# Patient Record
Sex: Female | Born: 1965
Health system: Southern US, Community
[De-identification: ages and names within clinical notes are randomized; demographics above are authoritative.]

## PROBLEM LIST (undated history)

## (undated) DIAGNOSIS — R87619 Unspecified abnormal cytological findings in specimens from cervix uteri: Secondary | ICD-10-CM

## (undated) HISTORY — PX: APPENDECTOMY: SHX54

## (undated) HISTORY — DX: Unspecified abnormal cytological findings in specimens from cervix uteri: R87.619

## (undated) HISTORY — PX: INTRAUTERINE DEVICE (IUD) INSERTION: SHX5877

---

## 1997-11-07 ENCOUNTER — Other Ambulatory Visit: Admission: RE | Admit: 1997-11-07 | Discharge: 1997-11-07 | Payer: Self-pay | Admitting: Obstetrics and Gynecology

## 1998-11-12 ENCOUNTER — Other Ambulatory Visit: Admission: RE | Admit: 1998-11-12 | Discharge: 1998-11-12 | Payer: Self-pay | Admitting: Obstetrics and Gynecology

## 1999-07-02 ENCOUNTER — Other Ambulatory Visit: Admission: RE | Admit: 1999-07-02 | Discharge: 1999-07-02 | Payer: Self-pay | Admitting: Obstetrics and Gynecology

## 1999-12-22 ENCOUNTER — Encounter: Payer: Self-pay | Admitting: Obstetrics and Gynecology

## 1999-12-22 ENCOUNTER — Ambulatory Visit (HOSPITAL_COMMUNITY): Admission: RE | Admit: 1999-12-22 | Discharge: 1999-12-22 | Payer: Self-pay | Admitting: Obstetrics and Gynecology

## 2000-02-06 ENCOUNTER — Inpatient Hospital Stay (HOSPITAL_COMMUNITY): Admission: AD | Admit: 2000-02-06 | Discharge: 2000-02-11 | Payer: Self-pay | Admitting: Obstetrics and Gynecology

## 2000-02-12 ENCOUNTER — Encounter: Admission: RE | Admit: 2000-02-12 | Discharge: 2000-05-05 | Payer: Self-pay | Admitting: Obstetrics and Gynecology

## 2000-03-16 ENCOUNTER — Other Ambulatory Visit: Admission: RE | Admit: 2000-03-16 | Discharge: 2000-03-16 | Payer: Self-pay | Admitting: Obstetrics and Gynecology

## 2000-05-03 ENCOUNTER — Encounter (INDEPENDENT_AMBULATORY_CARE_PROVIDER_SITE_OTHER): Payer: Self-pay | Admitting: *Deleted

## 2000-05-03 ENCOUNTER — Ambulatory Visit (HOSPITAL_COMMUNITY): Admission: RE | Admit: 2000-05-03 | Discharge: 2000-05-03 | Payer: Self-pay | Admitting: Gastroenterology

## 2001-03-23 ENCOUNTER — Other Ambulatory Visit: Admission: RE | Admit: 2001-03-23 | Discharge: 2001-03-23 | Payer: Self-pay | Admitting: Obstetrics and Gynecology

## 2001-04-18 ENCOUNTER — Other Ambulatory Visit: Admission: RE | Admit: 2001-04-18 | Discharge: 2001-04-18 | Payer: Self-pay | Admitting: Obstetrics and Gynecology

## 2001-10-12 ENCOUNTER — Other Ambulatory Visit: Admission: RE | Admit: 2001-10-12 | Discharge: 2001-10-12 | Payer: Self-pay | Admitting: Obstetrics and Gynecology

## 2002-03-07 ENCOUNTER — Other Ambulatory Visit: Admission: RE | Admit: 2002-03-07 | Discharge: 2002-03-07 | Payer: Self-pay | Admitting: Obstetrics and Gynecology

## 2003-04-12 ENCOUNTER — Other Ambulatory Visit: Admission: RE | Admit: 2003-04-12 | Discharge: 2003-04-12 | Payer: Self-pay | Admitting: Obstetrics and Gynecology

## 2003-04-19 ENCOUNTER — Encounter: Admission: RE | Admit: 2003-04-19 | Discharge: 2003-04-19 | Payer: Self-pay | Admitting: Obstetrics and Gynecology

## 2003-06-17 ENCOUNTER — Ambulatory Visit (HOSPITAL_COMMUNITY): Admission: RE | Admit: 2003-06-17 | Discharge: 2003-06-17 | Payer: Self-pay | Admitting: Gastroenterology

## 2004-03-17 ENCOUNTER — Other Ambulatory Visit: Admission: RE | Admit: 2004-03-17 | Discharge: 2004-03-17 | Payer: Self-pay | Admitting: Obstetrics and Gynecology

## 2005-04-14 ENCOUNTER — Other Ambulatory Visit: Admission: RE | Admit: 2005-04-14 | Discharge: 2005-04-14 | Payer: Self-pay | Admitting: Obstetrics and Gynecology

## 2005-06-10 ENCOUNTER — Encounter: Admission: RE | Admit: 2005-06-10 | Discharge: 2005-06-10 | Payer: Self-pay | Admitting: Obstetrics and Gynecology

## 2005-12-20 ENCOUNTER — Emergency Department (HOSPITAL_COMMUNITY): Admission: EM | Admit: 2005-12-20 | Discharge: 2005-12-20 | Payer: Self-pay | Admitting: Emergency Medicine

## 2006-04-14 ENCOUNTER — Other Ambulatory Visit: Admission: RE | Admit: 2006-04-14 | Discharge: 2006-04-14 | Payer: Self-pay | Admitting: Obstetrics & Gynecology

## 2006-06-22 ENCOUNTER — Encounter: Admission: RE | Admit: 2006-06-22 | Discharge: 2006-06-22 | Payer: Self-pay | Admitting: Obstetrics & Gynecology

## 2007-06-15 ENCOUNTER — Other Ambulatory Visit: Admission: RE | Admit: 2007-06-15 | Discharge: 2007-06-15 | Payer: Self-pay | Admitting: Obstetrics and Gynecology

## 2007-06-20 ENCOUNTER — Encounter: Admission: RE | Admit: 2007-06-20 | Discharge: 2007-06-20 | Payer: Self-pay | Admitting: Obstetrics & Gynecology

## 2007-11-07 ENCOUNTER — Encounter: Admission: RE | Admit: 2007-11-07 | Discharge: 2007-11-07 | Payer: Self-pay | Admitting: Obstetrics & Gynecology

## 2008-08-14 ENCOUNTER — Other Ambulatory Visit: Admission: RE | Admit: 2008-08-14 | Discharge: 2008-08-14 | Payer: Self-pay | Admitting: Obstetrics & Gynecology

## 2008-08-20 ENCOUNTER — Encounter: Admission: RE | Admit: 2008-08-20 | Discharge: 2008-08-20 | Payer: Self-pay | Admitting: Obstetrics & Gynecology

## 2009-06-17 IMAGING — US UNKNOWN US STUDY
1 series · 4 of 4 positions shown · non-contrast
Comparison: Prior studies

CLINICAL DATA: 6-month reevaluation of probably benign left
axillary lymph nodes

DIGITAL DIAGNOSTIC  LEFT BREAST  MAMMOGRAM  WITH CAD AND LEFT
BREAST ULTRASOUND

[Series 1: unknown us study · 4 of 4 slices shown]
[im 1/4]
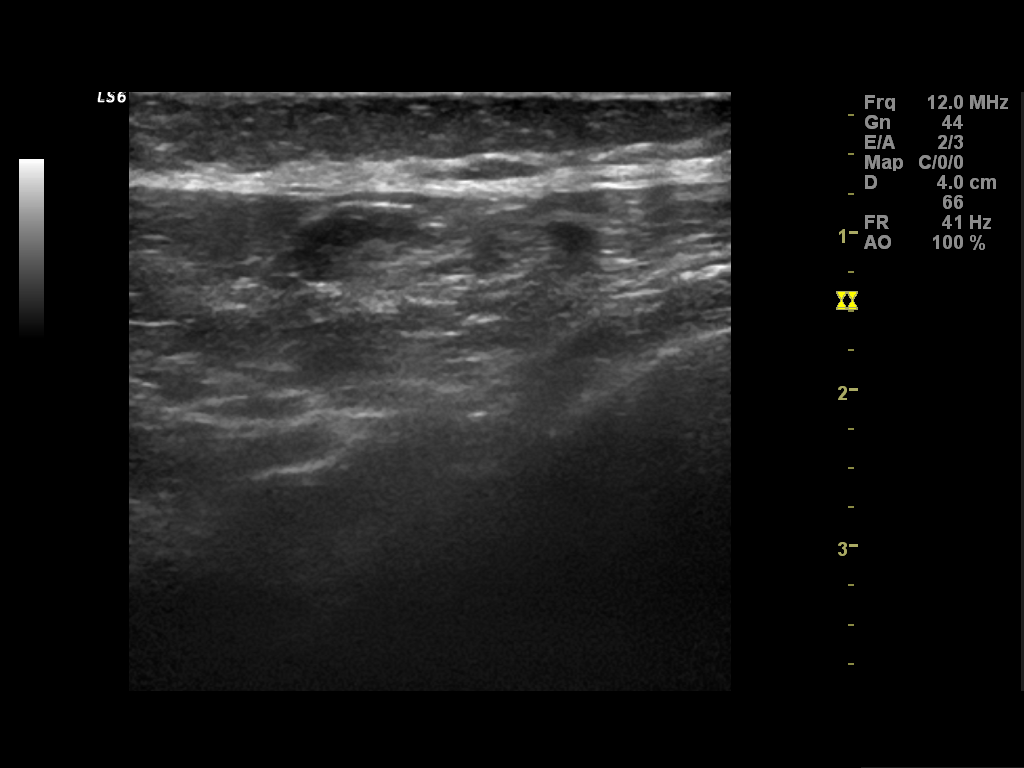
[im 2/4]
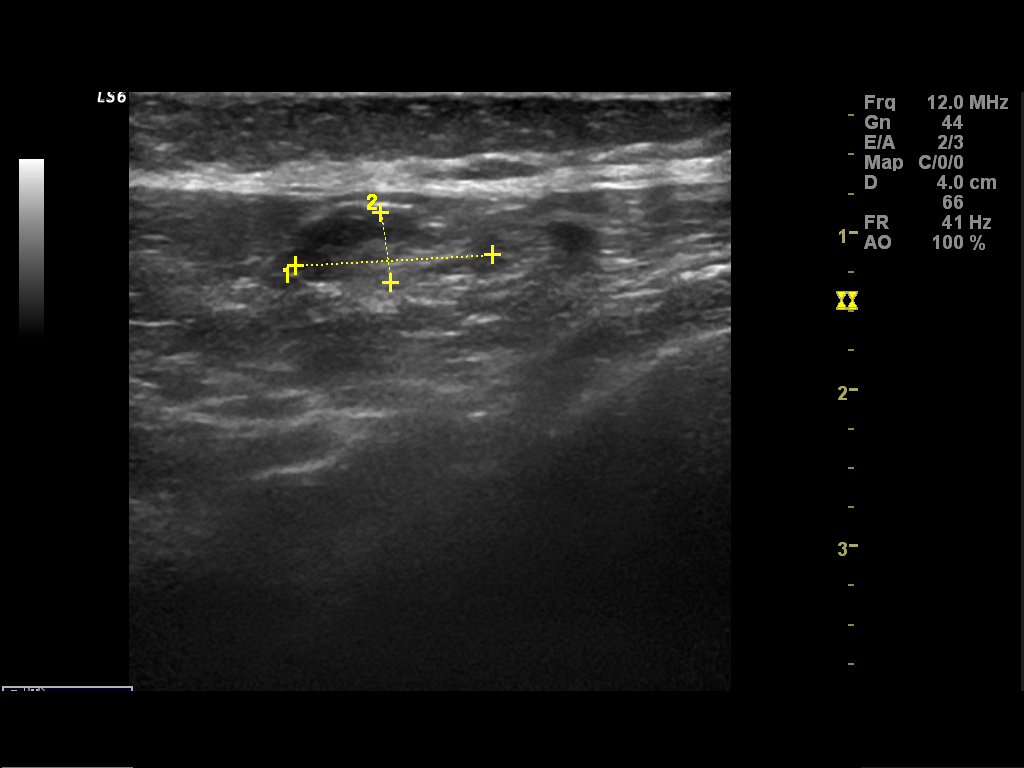
[im 3/4]
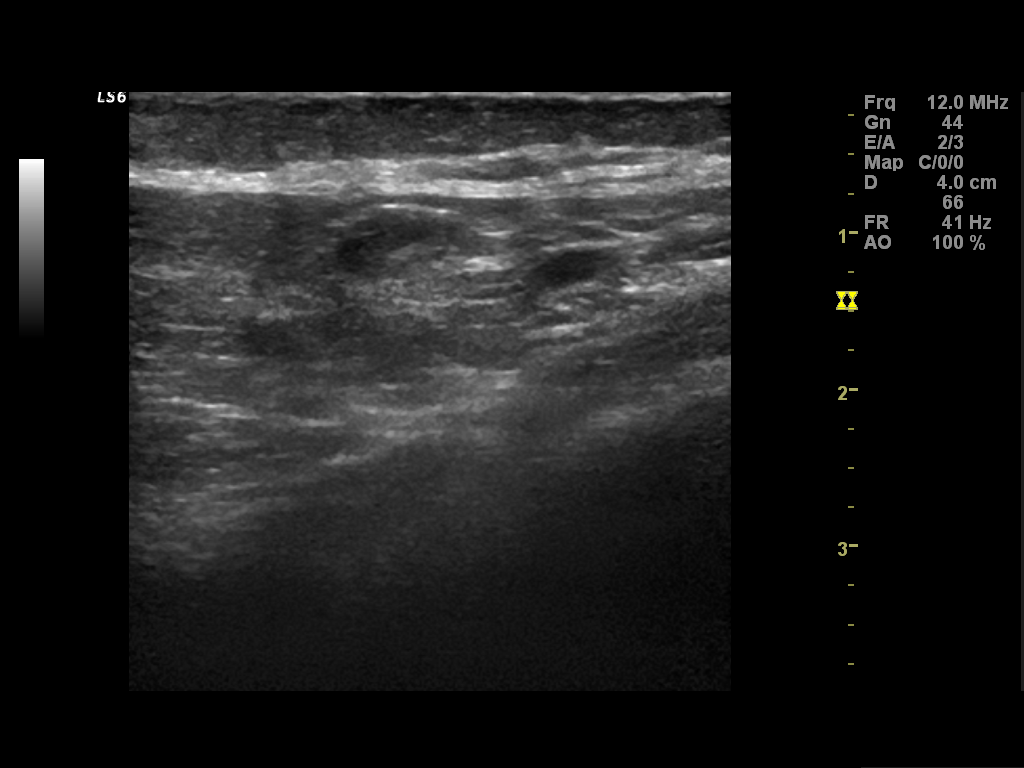
[im 4/4]
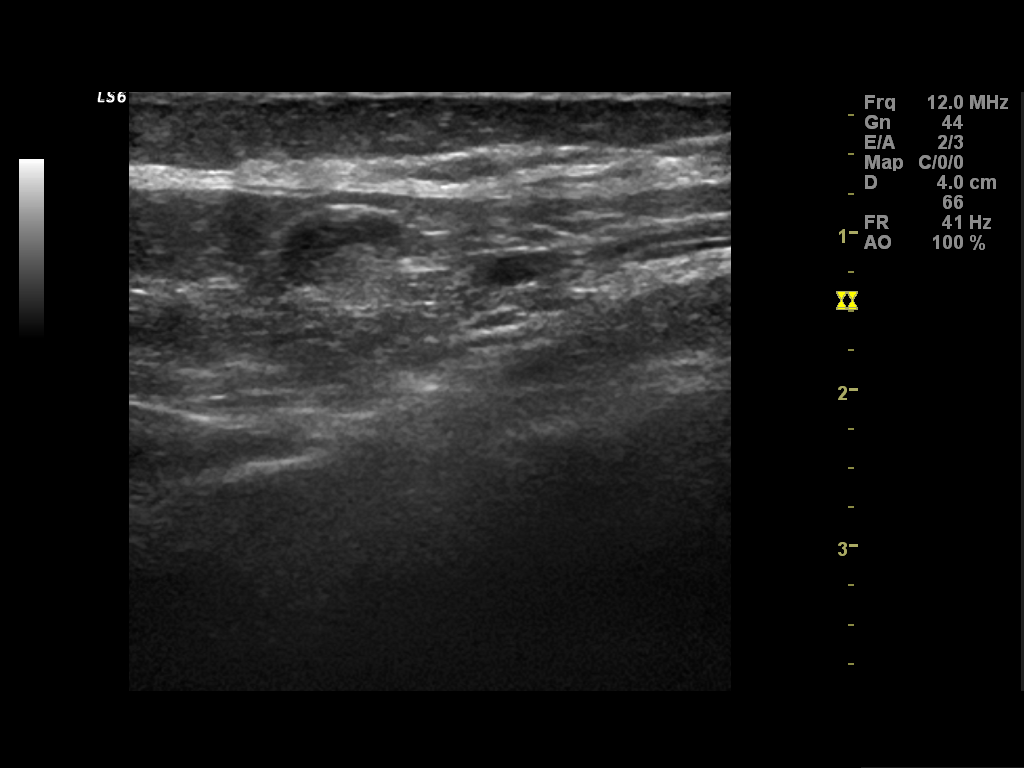

[4 of 4 positions shown; findings below may reference images not displayed]

FINDINGS: There is a heterogeneously dense breast parenchymal
pattern which is stable.  There is no evidence for mass and there
are no areas of distortion.

On physical exam, there is no palpable abnormality.

Ultrasound is performed, showing normal-appearing fibroglandular
tissue within the upper outer quadrant left breast.  The enlarged
left axillary lymph nodes have significantly decreased in size and
normal-appearing 1.3 x 0.5 cm size left axillary lymph node is
identified.  There is no worrisome left axillary adenopathy.
IMPRESSION: Interval decrease in size of the previously enlarged left axillary
lymph nodes.  This is consistent with resolution of reactive
adenopathy.  No specific evidence for malignancy.  Recommend return
to annual screening mammography in May 2008.

BI-RADS CATEGORY 2:  Benign finding(s).

## 2009-09-11 ENCOUNTER — Encounter: Admission: RE | Admit: 2009-09-11 | Discharge: 2009-09-11 | Payer: Self-pay | Admitting: Obstetrics and Gynecology

## 2010-09-28 ENCOUNTER — Other Ambulatory Visit: Payer: Self-pay | Admitting: Obstetrics and Gynecology

## 2010-09-28 DIAGNOSIS — Z1231 Encounter for screening mammogram for malignant neoplasm of breast: Secondary | ICD-10-CM

## 2010-10-15 ENCOUNTER — Ambulatory Visit
Admission: RE | Admit: 2010-10-15 | Discharge: 2010-10-15 | Disposition: A | Discharge status: Home / Self Care | Payer: BC Managed Care – PPO | Source: Ambulatory Visit | Attending: Obstetrics and Gynecology | Admitting: Obstetrics and Gynecology

## 2010-10-15 DIAGNOSIS — Z1231 Encounter for screening mammogram for malignant neoplasm of breast: Secondary | ICD-10-CM

## 2010-10-16 NOTE — Op Note (Signed)
[facility]  Patient:    Becky Lopez, Becky Lopez                         MRN: [id number] Proc. Date: 02/07/00 Adm. Date:  [id number] Attending:  Maxie Better                           Operative Report  PREOPERATIVE DIAGNOSES:       1. Arrest of dilatation.                               2. Asynclitism.                               3. Term gestation.  POSTOPERATIVE DIAGNOSES:      1. Arrest of dilatation.                               2. Right occiput transverse presentation.                               3. Macrosomia.  OPERATION/PROCEDURE:          1. Primary cesarean section.                               2. Kerrs hysterotomy.  ANESTHESIA:                   Epidural.  SURGEON:                      Sheronette A. Cherly Hensen, M.D.  ASSISTANT:                    Roseanna Rainbow, M.D.  DESCRIPTION OF PROCEDURE:     Under adequate epidural anesthesia the patient was placed in the supine position with left lateral tilt.  An indwelling Foley catheter had been placed prior to transfer to the operating room and urine was notable for hematuria prior to start of this procedure.  The patient was sterilely prepped and draped in the usual fashion and a Pfannenstiel skin incision then made and carried down to the rectus fascia using Bovie cautery. The rectus fascia was incised in the midline and the incision extended bilaterally to the rectus fascia and then bluntly and with cautery dissected off the rectus muscles in superior fashion.  The rectus muscles was split in the midline using Mayo scissors and the parietal peritoneum bluntly entered and the incision extended superiorly and inferiorly.  The vesicouterine peritoneum was developed and the bladder then bluntly dissected off the lower uterine segment and displaced on the operative field using a Gilliams retractor.  A low transverse uterine incision was then made and the incision extended bilaterally  using bandage scissors.  A copious amount of fluid was noted without any meconium.  Subsequent delivery of a viable female infant from the right occiput transverse presentation was accomplished.  The infant was bulb suctioned on the abdomen and the cord clamped and cut, and the infant was transferred to the awaiting pediatricians, who subsequently assigned Apgar scores of 8 at one minute and 9 at  five minutes.  The placenta was spontaneously delivered intact.  Normal tubes and ovaries were noted bilaterally.  The uterine cavity was cleaned of debris and no intrauterine abnormalities were noted.  The uterine incision was closed in two layers, the first with a running locked stitch of 0 Monocryl and the second layer imbricated using 0 Monocryl suture.  Small bleeders along the vesicouterine peritoneum edge were cauterized and the abdomen irrigated and suctioned of any debris.  The pericolic gutters were cleaned of debris and the rectus muscles and undersurface of the rectus fascia inspected.  Small bleeders were cauterized, with good hemostasis subsequently noted.  The rectus fascia was closed with 0 Vicryl x 2.  The subcutaneous area was irrigated and small bleeders cauterized.  The subcuticular area was injected with Marcaine 0.25% and the skin approximated using Ethicon staples.  Specimen was placenta, not sent to pathology.  Estimated blood loss was 600 cc.  Intraoperative fluid was 3 liters crystalloid.  Urine output was 1000 cc of clear yellow urine. Sponge, needle, and instrument counts x 2 were correct.  There were no complications.  The patient tolerated the procedure well and was transferred to the recovery room in stable condition. DD:  02/07/00 TD:  02/08/00 Job: [id number]

## 2010-10-16 NOTE — Discharge Summary (Signed)
[facility]  Patient:    Becky Lopez, Becky Lopez                         MRN: [id number] Adm. Date:  [id number] Disc. Date: [id number] Attending:  Maxie Better                           Discharge Summary  ADMITTING DIAGNOSES:          1. Active labor, term gestation.                               2. Pregnancy-induced hypertension, rule out                                  preeclampsia.                               3. Recurrent herpes simplex outbreak.  DISCHARGE DIAGNOSES:          1. Term gestation, delivered, status post                                  primary cesarean section.                               2. Recurrent herpes simplex outbreak.                               3. Fetal macrosomia.                               4. Arrest of dilatation.  PROCEDURE:                    Primary cesarean section.  HOSPITAL COURSE:              This is a 45 year old gravida 3, para 0, female at term, presented in active labor February 06, 2000.  At that time, the patient had initially presented whereupon her cervical exam was notable for her cervix being 4 cm dilatation.  The patient ambulated for about two hours, and her cervix progressed to 5 cm.  The patient was noted to have a left buttock vesicles consistent with herpes infection.  Her prenatal course had been uncomplicated, blood type being A positive.  Antibody screen was negative.  Rubella was immune.  Hepatitis B surface antigen was negative.  The patient underwent an amniocentesis which showed a normal karyotype.  Group B strep culture was negative.  The patient had a blood pressure of 163/92 with no other findings suggestive of preeclampsia.  PIH labs were obtained.  The tracing was reactive.  She had irregular contractions.  Due to the location of the herpes simplex outbreak, the area being far away from the vaginal area, did not necessitate a cesarean section.  The [facility] labs were normal.   The patient subsequently had an epidural placement for labor pain management with a cervical exam that remained unchanged.  The patient had arrest of dilatation secondary  to inadequate labor force.  The patient progressed to 9 cm with a swollen anterior lip.  Her active phase became protracted.  Pitocin was continued.  Intrauterine pressure catheter was subsequently placed.  Her [place] units were between 175 and 210.  The patient was contracting every 2-3 minutes.  Her cervix remained unchanged at 9 cm with a swollen anterior lip, +1 station, asynclitic presentation.  The patient was advised of the need for a primary cesarean section due to the findings.  She was therefore taken to the operating room where she underwent a primary cesarean section.  The findings were that of a baby that was 9 pounds 7 ounces, in the right occipital transverse presentation, live female.  Normal tubes and ovaries were noted.  Placenta was spontaneous intact.  Her estimated blood loss was 600 cc. The patients postoperative course was notable for a temperature maximum of 101.8.  She had been diagnosed with a possible mild endomyometritis and was started on ______ gentamicin, and clindamycin on February 08, 2000, after she had her initial temperature spike.  The patient was continued on her antibiotics until afebrile for greater than 24 hours.  Her CBC on Postoperative day #1 showed a white count of 16.7, hemoglobin 9.4, hematocrit 27.2.  On postoperative day #4, incision was inspected and slight erythema noted.  The incision was probed for about 10 cc of serosanguineous fluid and intact rectal fascia.  This finding was consistent with a wound seroma.  The patient did not have her staples removed.  She was felt otherwise well to be discharged home having been afebrile for greater than 24 hours, tolerating a regular diet, and no other clinical findings.  DISPOSITION:                  Home.  CONDITION ON  DISCHARGE:       Stable.  DISCHARGE INSTRUCTIONS:       Follow up in the office for staple removal on Monday and a postpartum check at four weeks.  Call for temperature greater or equal to 100.4, increased incisional pain, redness, or incisional drainage. Nothing per vagina for 4-6 weeks.  No driving or heavy lifting for two weeks. Call for severe abdominal pain,  ______ frequently, passage of clots. DD:  02/28/00 TD:  02/28/00 Job: [id number]

## 2010-10-16 NOTE — Op Note (Signed)
NAME:  Becky Lopez, Becky Lopez                            ACCOUNT NO.:  [id number]   MEDICAL RECORD NO.:  [id number]                   PATIENT TYPE:  AMB   LOCATION:  ENDO                                 FACILITY:  [facility]   PHYSICIAN:  Anselmo Rod, M.D.               DATE OF BIRTH:  07-22-1965   DATE OF PROCEDURE:  06/12/2003  DATE OF DISCHARGE:                                 OPERATIVE REPORT   PROCEDURE PERFORMED:  Screening colonoscopy.   ENDOSCOPIST:  Charna Elizabeth, M.D.   INSTRUMENT USED:  Olympus video colonoscope.   INDICATIONS FOR PROCEDURE:  The patient is a 45 year old white female with a  personal history of adenomatous polyps undergoing a complete colonoscopy.  Rule out colonic polyps, masses, etc.   PREPROCEDURE PREPARATION:  Informed consent was procured from the patient.  The patient was fasted for eight hours prior to the procedure and prepped  with a bottle of magnesium citrate and a gallon of GoLYTELY the night prior  to the procedure.   PREPROCEDURE PHYSICAL:  The patient had stable vital signs.  Neck supple.  Chest clear to auscultation.  S1 and S2 regular.  Abdomen soft with normal  bowel sounds.   DESCRIPTION OF PROCEDURE:  The patient was placed in left lateral decubitus  position and sedated with 70 mg of Demerol and 8 mg of Versed intravenously.  Once the patient was adequately sedated and maintained on low flow oxygen  and continuous cardiac monitoring, the Olympus video colonoscope was  advanced  from the rectum to the cecum with difficulty.  There was a large  amount of residual stool in the colon.  Multiple washes were done.  The  patient's position was changed from the left lateral to the supine position  to mobilize the stool and visualize the underlying colonic mucosa.  The  appendicular orifice and ileocecal valve were clearly visualized and  photographed.  No masses, polyps, erosions, ulcerations or diverticula were  seen.  Retroflexion in the rectum  revealed no abnormalities.   IMPRESSION:  1. Essentially normal colonoscopy.  No masses or polyps seen.  2. Some residual stool in the colon.  Small lesions could have been missed.   RECOMMENDATIONS:  1. Repeat colorectal cancer screening is recommended in the next five years     unless the patient develops any     abnormal symptoms in the interim.  2. Continue high fiber diet with liberal fluid intake.  3. Outpatient followup as need arises in the future.                                               Anselmo Rod, M.D.    JNM/MEDQ  D:  06/17/2003  T:  06/17/2003  Job:  [id number]  cc:   Crist Fat. Rivard, M.D.  [address]  [place]  [place] [address]  Fax: [phone]

## 2010-10-16 NOTE — Procedures (Signed)
[place]  Patient:    Becky Lopez, Becky Lopez                         MRN: [id number] Proc. Date: 05/03/00 Adm. Date:  [id number] Disc. Date: [id number] Attending:  Charna Elizabeth CC:         Hayes Ludwig, [facility]   Procedure Report  DATE OF BIRTH:  November 19, 1955.  PROCEDURE:  Colonoscopy with snare polypectomy x 1.  ENDOSCOPIST:  Anselmo Rod, M.D.  INSTRUMENT USED:  Olympus video colonoscope.  INDICATION FOR PROCEDURE:  A 45 year old white female with a history of rectal bleeding and a family history of colon cancer in her father.  Rule out colonic polyps, masses, hemorrhoids, etc.  PREPROCEDURE PREPARATION:  Informed consent was procured from the patient. The patient was fasted for eight hours prior to the procedure and prepped with a bottle of magnesium citrate and a gallon of NuLytely the night prior to the procedure.  PREPROCEDURE PHYSICAL:  VITAL SIGNS:  The patient had stable vital signs.  NECK:  Supple.  CHEST:  Clear to auscultation.  S1, S2 regular.  ABDOMEN:  Soft with normal abdominal bowel sounds.  DESCRIPTION OF PROCEDURE:  The patient was placed in the left lateral decubitus position and sedated with 70 mg of Demerol and 5 mg of Versed intravenously.  Once the patient was adequately sedate and maintained on low-flow oxygen and continuous cardiac monitoring, the Olympus video colonoscope was advanced into the rectum to the cecum without difficulty. a small sessile polyp was snared from 10 cm.  The colon was otherwise normal up to the cecum.  IMPRESSION:  Normal colonoscopy except for a small sessile polyp snared at 10 cm.  RECOMMENDATIONS: 1. Await pathology results. 2. Avoid nonsteroidals for the next 10 days. 3. Outpatient follow-up in the next two weeks. DD:  05/04/00 TD:  05/05/00 Job: [id number]

## 2011-04-22 IMAGING — MG MM DIGITAL SCREENING
4 series · 4 of 4 positions shown · non-contrast
Comparison: none

DG SCREEN MAMMOGRAM BILATERAL
Bilateral CC and MLO view(s) were taken.
Technologist: John Mckay Montesin

DIGITAL SCREENING MAMMOGRAM WITH CAD:
The breast tissue is heterogeneously dense.  No masses or malignant type calcifications are 
identified.  Compared with prior studies.
Images were processed with CAD.

[R CC]
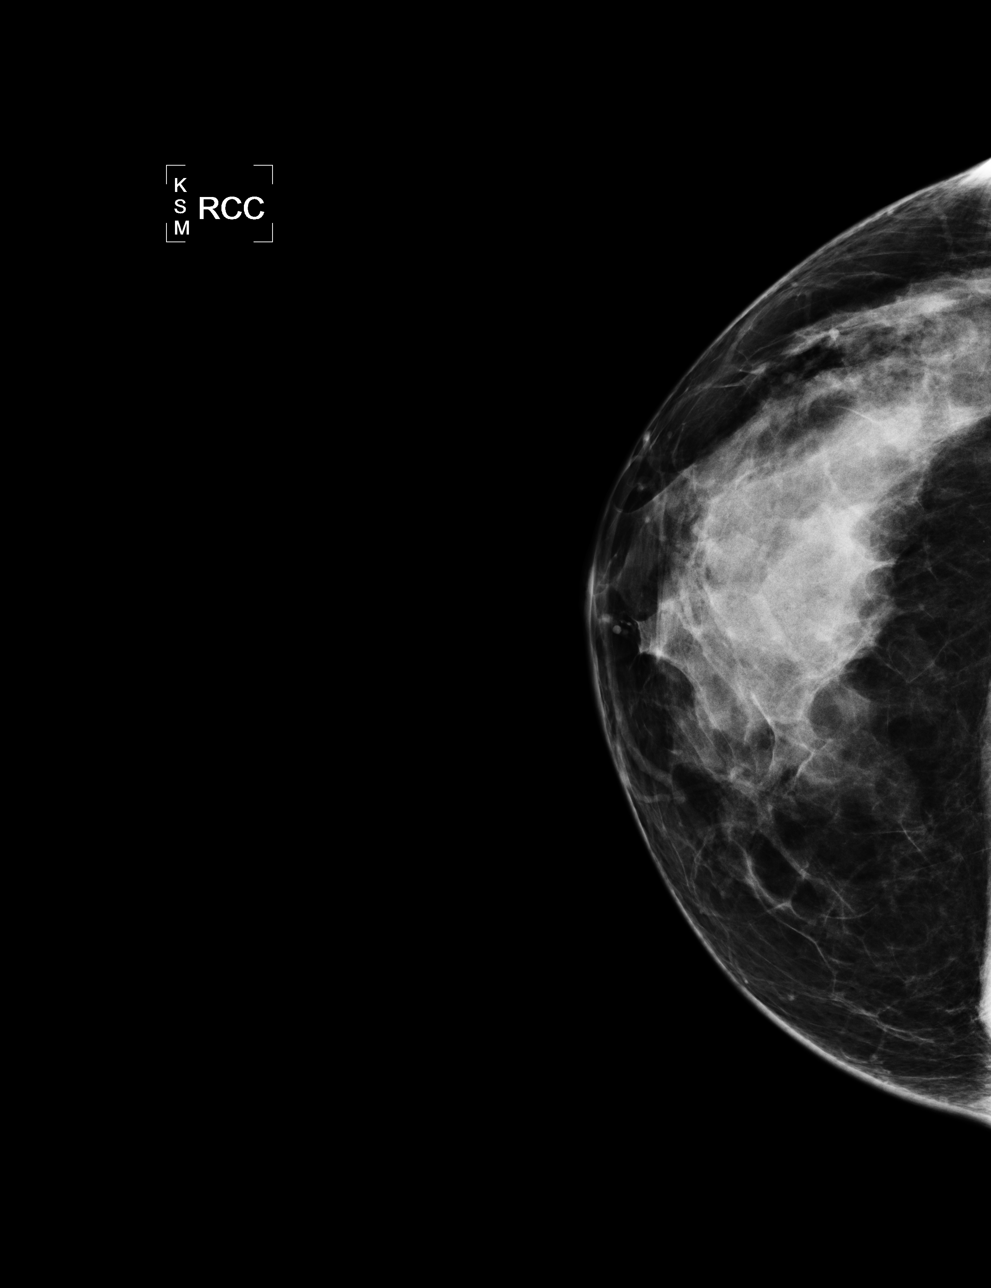

[L CC]
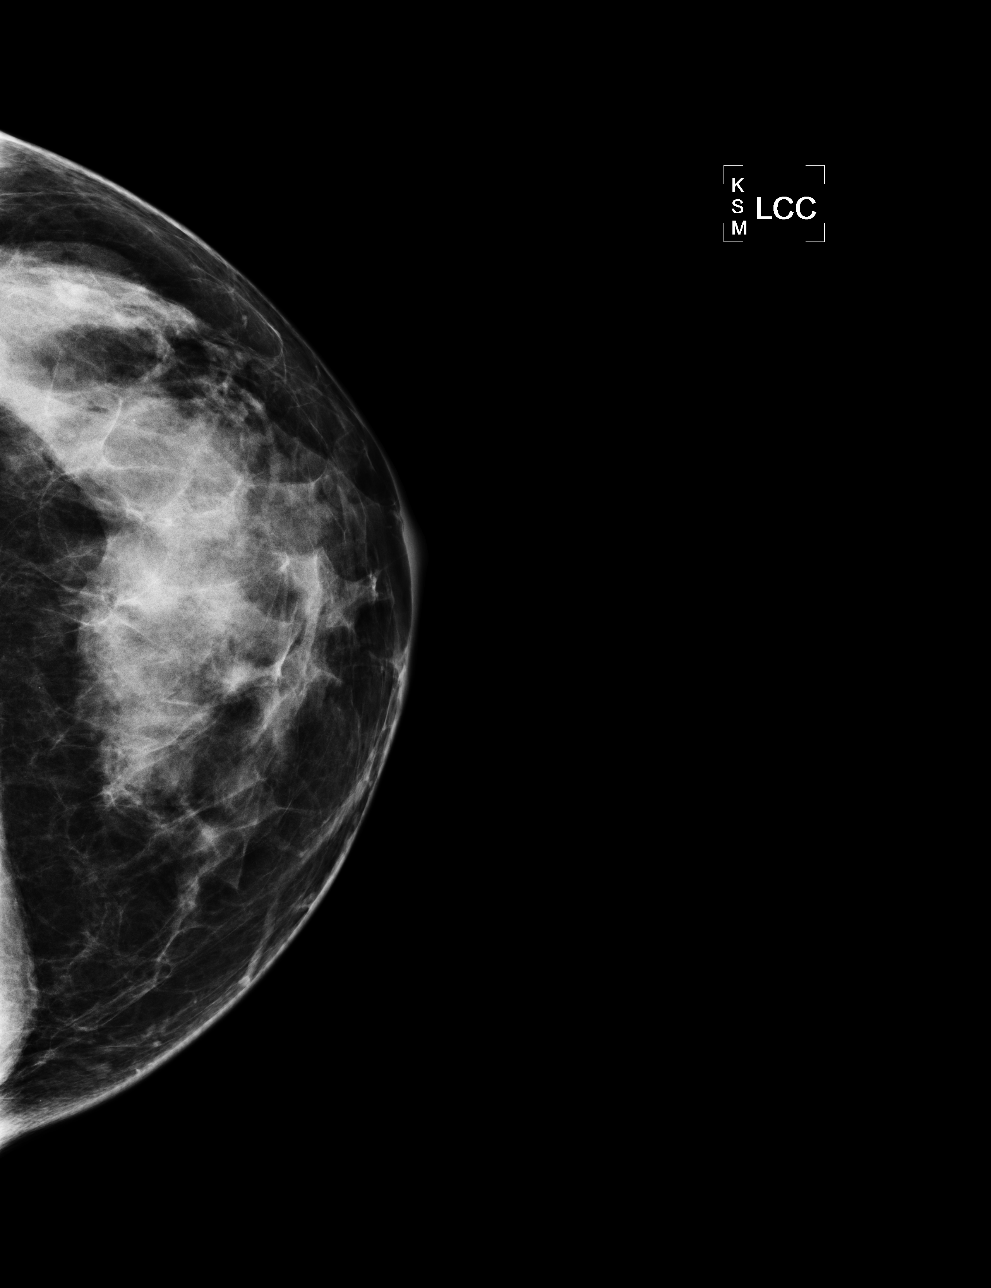

[L MLO]
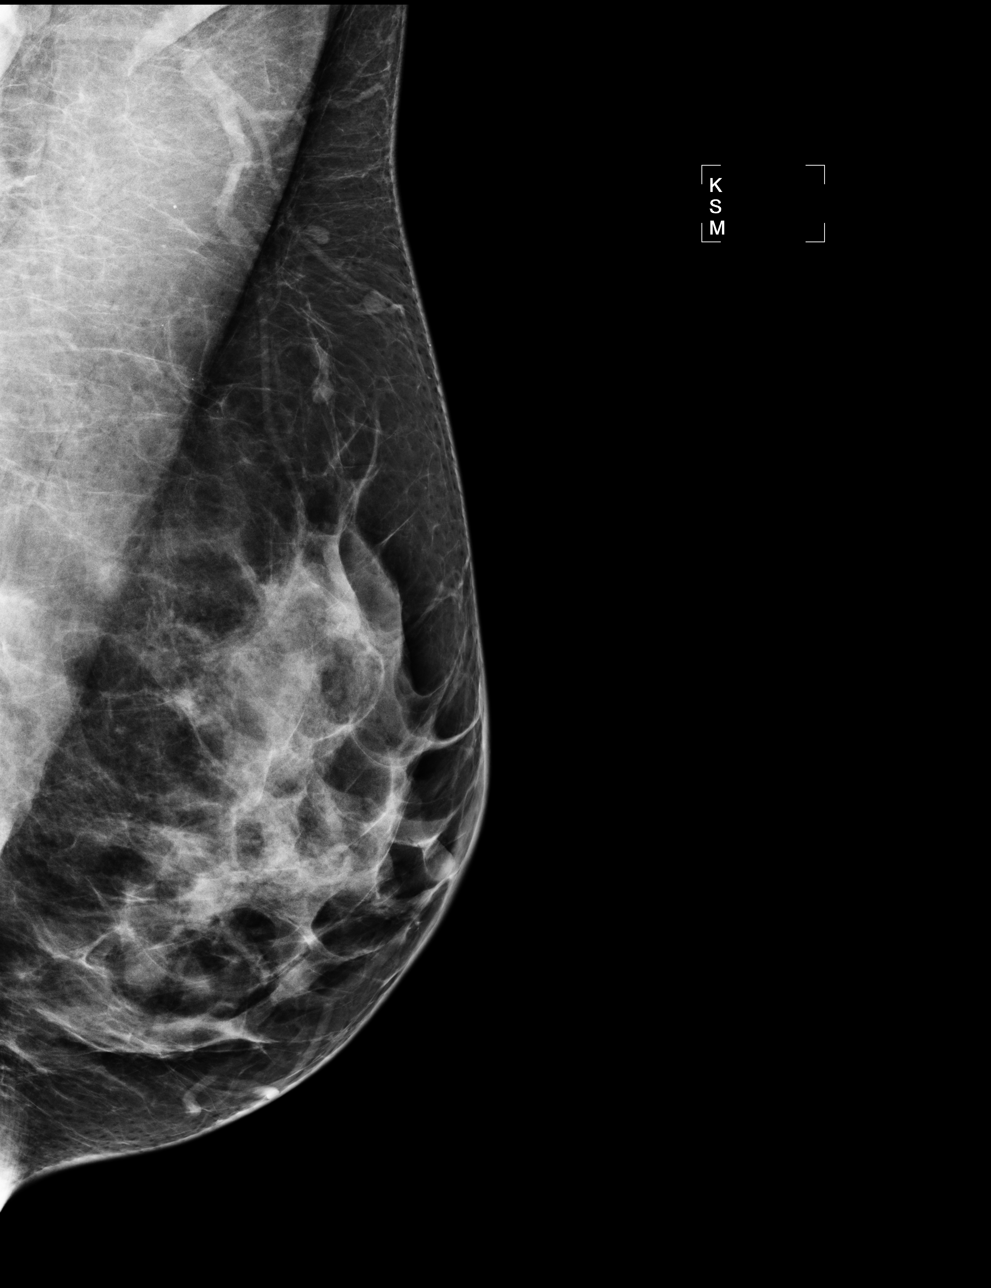

[R MLO]
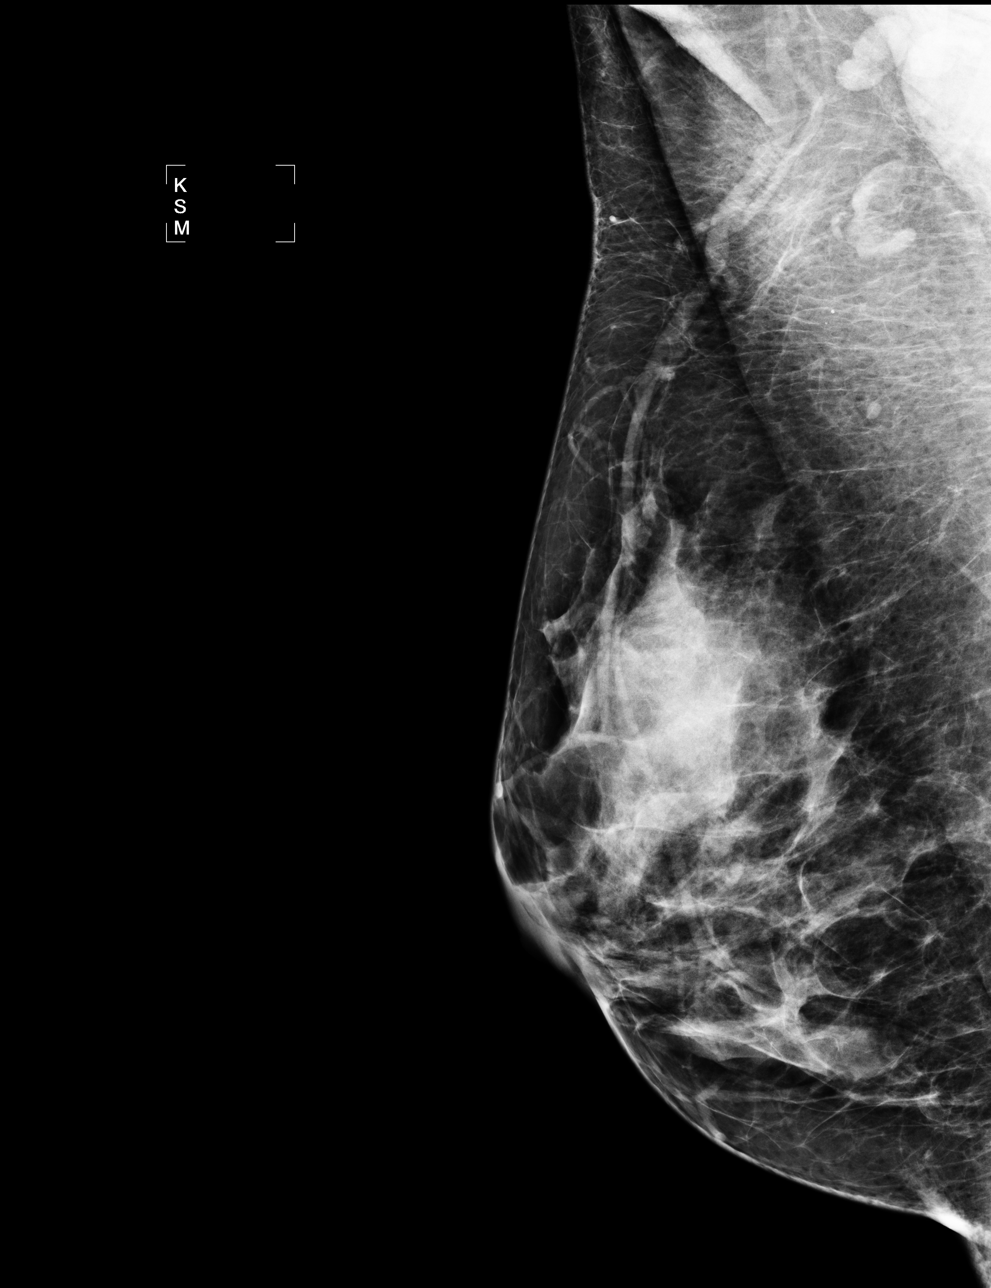

[4 of 4 positions shown; findings below may reference images not displayed]

IMPRESSION: No specific mammographic evidence of malignancy.  Next screening mammogram is recommended in one 
year.

A result letter of this screening mammogram will be mailed directly to the patient.

ASSESSMENT: Negative - BI-RADS 1

Screening mammogram in 1 year.
,

## 2011-11-22 ENCOUNTER — Other Ambulatory Visit: Payer: Self-pay | Admitting: Obstetrics and Gynecology

## 2011-11-22 DIAGNOSIS — Z1231 Encounter for screening mammogram for malignant neoplasm of breast: Secondary | ICD-10-CM

## 2011-11-24 ENCOUNTER — Ambulatory Visit
Admission: RE | Admit: 2011-11-24 | Discharge: 2011-11-24 | Disposition: A | Discharge status: Home / Self Care | Payer: BC Managed Care – PPO | Source: Ambulatory Visit | Attending: Obstetrics and Gynecology | Admitting: Obstetrics and Gynecology

## 2011-11-24 DIAGNOSIS — Z1231 Encounter for screening mammogram for malignant neoplasm of breast: Secondary | ICD-10-CM

## 2012-10-31 ENCOUNTER — Other Ambulatory Visit: Payer: Self-pay

## 2012-10-31 DIAGNOSIS — Z1231 Encounter for screening mammogram for malignant neoplasm of breast: Secondary | ICD-10-CM

## 2012-11-23 ENCOUNTER — Encounter: Payer: Self-pay | Admitting: *Deleted

## 2012-11-27 ENCOUNTER — Encounter: Payer: Self-pay | Admitting: Certified Nurse Midwife

## 2012-11-27 ENCOUNTER — Ambulatory Visit (INDEPENDENT_AMBULATORY_CARE_PROVIDER_SITE_OTHER): Payer: BC Managed Care – PPO | Admitting: Certified Nurse Midwife

## 2012-11-27 VITALS — BP 110/64 | HR 60 | Resp 16 | Ht 67.25 in | Wt 159.0 lb

## 2012-11-27 DIAGNOSIS — Z0000 Encounter for general adult medical examination without abnormal findings: Secondary | ICD-10-CM

## 2012-11-27 DIAGNOSIS — Z01419 Encounter for gynecological examination (general) (routine) without abnormal findings: Secondary | ICD-10-CM

## 2012-11-27 LAB — COMPREHENSIVE METABOLIC PANEL
AST: 16 U/L (ref 0–37)
Albumin: 4.2 g/dL (ref 3.5–5.2)
BUN: 14 mg/dL (ref 6–23)
Calcium: 9.2 mg/dL (ref 8.4–10.5)
Chloride: 105 mEq/L (ref 96–112)
Creat: 0.75 mg/dL (ref 0.50–1.10)
Glucose, Bld: 88 mg/dL (ref 70–99)

## 2012-11-27 LAB — POCT URINALYSIS DIPSTICK
Blood, UA: NEGATIVE
Ketones, UA: NEGATIVE
Leukocytes, UA: NEGATIVE
Nitrite, UA: NEGATIVE
Protein, UA: NEGATIVE
pH, UA: 5

## 2012-11-27 LAB — LIPID PANEL
HDL: 63 mg/dL (ref >39)
Total CHOL/HDL Ratio: 2.9 Ratio
Triglycerides: 87 mg/dL (ref <150)

## 2012-11-27 LAB — HEMOGLOBIN, FINGERSTICK: Hemoglobin, fingerstick: 12.8 g/dL (ref 12.0–16.0)

## 2012-11-27 NOTE — Progress Notes (Signed)
47 y.o. G57P1 Married Caucasian Fe here for annual exam. Periods scant with Mirena IUD. Happy with contraception choice. Has not had reason to see PCP. 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 No health issues today.  Mammogram scheduled for tomorrow.   Patient's last menstrual period was 11/25/2012.          Sexually active: yes  The current method of family planning is IUD.    Exercising: yes  run, swim, run a farm Smoker:  no  Health Maintenance: Pap: 11-24-11 neg HPV HR neg MMG:  11-24-11 normal Colonoscopy:  2011 BMD:   none TDaP:  06-15-07 h Labs: Poct urine- neg Self breast exam: occ   reports that she has quit smoking. She does not have any smokeless tobacco history on file. She reports that she drinks about 0.6 ounces of alcohol per week. She reports that she does not use illicit drugs.  History reviewed. No pertinent past medical history.  History reviewed. No pertinent past surgical history.  Current Outpatient Prescriptions  Medication Sig Dispense Refill  . levonorgestrel (MIRENA) 20 MCG/24HR IUD 1 each by Intrauterine route once.       No current facility-administered medications for this visit.    Family History  Problem Relation Age of Onset  . Hypertension Mother   . Diabetes Father   . Coronary artery disease Father   . Colon cancer Father     ROS:  Pertinent items are noted in HPI.  Otherwise, a comprehensive ROS was negative.  Exam:   BP 110/64  Pulse 60  Resp 16  Ht 5' 7.25" (1.708 m)  Wt 159 lb (72.122 kg)  BMI 24.72 kg/m2  LMP 11/25/2012 Height: 5' 7.25" (170.8 cm)  Ht Readings from Last 3 Encounters:  11/27/12 5' 7.25" (1.708 m)    General appearance: alert, cooperative and appears stated age Head: Normocephalic, without obvious abnormality, atraumatic Neck: no adenopathy, supple, symmetrical, trachea midline and thyroid normal to inspection and palpation Lungs: clear to auscultation bilaterally Breasts: normal appearance, no masses or  tenderness, No nipple retraction or dimpling, No nipple discharge or bleeding, No axillary or supraclavicular adenopathy Heart: regular rate and rhythm Abdomen: soft, non-tender; no masses,  no organomegaly Extremities: extremities normal, atraumatic, no cyanosis or edema Skin: Skin color, texture, turgor normal. No rashes or lesions Lymph nodes: Cervical, supraclavicular, and axillary nodes normal. No abnormal inguinal nodes palpated Neurologic: Grossly normal   Pelvic: External genitalia:  no lesions              Urethra:  normal appearing urethra with no masses, tenderness or lesions              Bartholin's and Skene's: normal                 Vagina: normal appearing vagina with normal color and discharge, no lesions              Cervix: normal, non tender, IUD string noted              Pap taken: no Bimanual Exam:  Uterus:  normal size, contour, position, consistency, mobility, non-tender and anteverted              Adnexa: normal adnexa and no mass, fullness, tenderness               Rectovaginal: Confirms               Anus:  normal sphincter tone, no lesions  A:  Well  Woman with normal exam  Contraception Mirena IUD , removal due 2017  P:   Reviewed health and wellness pertinent to exam  Aware of bleeding profile with Mirena IUD  Labs: CMP,Lipid panel, TSH, Hgb.  Pap smear as per guidelines   Mammogram yearly pap smear not taken today  counseled on breast self exam, mammography screening, adequate intake of calcium and vitamin D, diet and exercise, Kegel's exercises  return annually or prn  An After Visit Summary was printed and given to the patient.

## 2012-11-27 NOTE — Patient Instructions (Signed)

## 2012-11-28 ENCOUNTER — Ambulatory Visit: Payer: BC Managed Care – PPO

## 2012-11-28 NOTE — Progress Notes (Signed)
Reviewed CPRomine, MD 

## 2012-11-29 ENCOUNTER — Telehealth: Payer: Self-pay

## 2012-11-29 NOTE — Telephone Encounter (Signed)
Message copied by Eliezer Bottom on Wed Nov 29, 2012 10:24 AM ------      Message from: Verner Chol      Created: Wed Nov 29, 2012  7:57 AM       Cholesterol panel optimal level, Liver, kidney, glucose profile normal      Thyroid level normal ------

## 2012-11-29 NOTE — Telephone Encounter (Signed)
Patient notified of results.

## 2012-11-29 NOTE — Telephone Encounter (Signed)
lmtcb

## 2013-01-11 ENCOUNTER — Ambulatory Visit
Admission: RE | Admit: 2013-01-11 | Discharge: 2013-01-11 | Disposition: A | Discharge status: Home / Self Care | Payer: BC Managed Care – PPO | Source: Ambulatory Visit

## 2013-01-11 DIAGNOSIS — Z1231 Encounter for screening mammogram for malignant neoplasm of breast: Secondary | ICD-10-CM

## 2013-04-05 ENCOUNTER — Other Ambulatory Visit: Payer: Self-pay

## 2013-11-28 ENCOUNTER — Ambulatory Visit (INDEPENDENT_AMBULATORY_CARE_PROVIDER_SITE_OTHER): Payer: BC Managed Care – PPO | Admitting: Certified Nurse Midwife

## 2013-11-28 ENCOUNTER — Encounter: Payer: Self-pay | Admitting: Certified Nurse Midwife

## 2013-11-28 VITALS — BP 120/78 | HR 72 | Resp 16 | Ht 67.25 in | Wt 163.0 lb

## 2013-11-28 DIAGNOSIS — Z124 Encounter for screening for malignant neoplasm of cervix: Secondary | ICD-10-CM

## 2013-11-28 DIAGNOSIS — Z01419 Encounter for gynecological examination (general) (routine) without abnormal findings: Secondary | ICD-10-CM

## 2013-11-28 DIAGNOSIS — N390 Urinary tract infection, site not specified: Secondary | ICD-10-CM

## 2013-11-28 DIAGNOSIS — Z0000 Encounter for general adult medical examination without abnormal findings: Secondary | ICD-10-CM

## 2013-11-28 LAB — POCT URINALYSIS DIPSTICK
Bilirubin, UA: NEGATIVE
Glucose, UA: NEGATIVE
Ketones, UA: NEGATIVE
NITRITE UA: NEGATIVE
PROTEIN UA: NEGATIVE
UROBILINOGEN UA: NEGATIVE
pH, UA: 8

## 2013-11-28 MED ORDER — NITROFURANTOIN MONOHYD MACRO 100 MG PO CAPS: 100.0000 mg | ORAL_CAPSULE | Freq: Two times a day (BID) | ORAL | Status: DC

## 2013-11-28 NOTE — Progress Notes (Signed)
48 y.o. [id number] Married Caucasian Fe here for annual exam. Periods very scant or spotting with Mirena IUD, happy with choice. Patient complaining of urinary frequency, urgency and pain with urination for the past few days and has increased." Feels like UTI". Patient has had in past but several years ago. Sees PCP prn. Denies fever, chills or back ache. Patient did see blood in urine this am. No history of kidney stones. No other health issues.  Patient's last menstrual period was 11/24/2013.          Sexually active: Yes.    The current method of family planning is IUD.    Exercising: Yes.    Swim, run, lift weights & yoga Smoker:  no  Health Maintenance: Pap: 11-21-11 neg HPV HR neg MMG: 01-11-13  Density category c: bir-rads category 1: neg Colonoscopy: 2011 5 years BMD:   none TDaP:  2009 Labs: Poct urine-wbc 2+, rbc 2+, ph 8.0, hgb-12.6 Self breast exam: done monthly   reports that she has quit smoking. She does not have any smokeless tobacco history on file. She reports that she drinks about 3.6 ounces of alcohol per week. She reports that she does not use illicit drugs.  History reviewed. No pertinent past medical history.  Past Surgical History  Procedure Laterality Date  . Appendectomy    . Cesarean section    . Intrauterine device (iud) insertion      mirena inserted 06-05-10, due out 1/17    Current Outpatient Prescriptions  Medication Sig Dispense Refill  . levonorgestrel (MIRENA) 20 MCG/24HR IUD 1 each by Intrauterine route once.       No current facility-administered medications for this visit.    Family History  Problem Relation Age of Onset  . Hypertension Mother   . Diabetes Father   . Coronary artery disease Father   . Colon cancer Father   . Cancer Father     prostate  . Emphysema Maternal Grandfather   . Cancer Paternal Grandmother   . Coronary artery disease Paternal Grandfather     ROS:  Pertinent items are noted in HPI.  Otherwise, a comprehensive  ROS was negative.  Exam:   BP 120/78  Pulse 72  Resp 16  Ht 5' 7.25" (1.708 m)  Wt 163 lb (73.936 kg)  BMI 25.34 kg/m2  LMP 11/24/2013 Height: 5' 7.25" (170.8 cm)  Ht Readings from Last 3 Encounters:  11/28/13 5' 7.25" (1.708 m)  11/27/12 5' 7.25" (1.708 m)    General appearance: alert, cooperative and appears stated age Head: Normocephalic, without obvious abnormality, atraumatic Neck: no adenopathy, supple, symmetrical, trachea midline and thyroid normal to inspection and palpation and non-palpable Lungs: clear to auscultation bilaterally CVAT bilateral negative Breasts: normal appearance, no masses or tenderness, No nipple retraction or dimpling, No nipple discharge or bleeding, No axillary or supraclavicular adenopathy Heart: regular rate and rhythm Abdomen: soft, positive for suprapubic tenderness; no masses,  no organomegaly Extremities: extremities normal, atraumatic, no cyanosis or edema Skin: Skin color, texture, turgor normal. No rashes or lesions, warm and dry Lymph nodes: Cervical, supraclavicular, and axillary nodes normal. No abnormal inguinal nodes palpated Neurologic: Grossly normal   Pelvic: External genitalia:  no lesions              Urethra:  normal appearing urethra with no masses, with tenderness or lesions  Bladder and urethral meatus tender              Bartholin's and Skene's: normal  Vagina: normal appearing vagina with normal color and discharge, no lesions              Cervix: normal, non tender              Pap taken: Yes.   Bimanual Exam:  Uterus:  normal size, contour, position, consistency, mobility, non-tender and anteflexed              Adnexa: normal adnexa and no mass, fullness, tenderness               Rectovaginal: Confirms               Anus:  normal sphincter tone, no lesions  A:  Well Woman with normal exam  Contraception Mirena IUD removal due 2017  UTI  P:   Reviewed health and wellness pertinent to exam  Aware  of warning signs with IUD  Rx Macrobid see order  Warning signs of UTI given  Lab Urine culture, micro  Pap smear taken today with HPV reflex   counseled on breast self exam, mammography screening and need for 3 D mammogram due to breast density C, adequate intake of calcium and vitamin D, diet and exercise  return annually or prn  An After Visit Summary was printed and given to the patient.

## 2013-11-29 LAB — URINALYSIS, MICROSCOPIC ONLY
BACTERIA UA: NONE SEEN
Casts: NONE SEEN
Crystals: NONE SEEN
Squamous Epithelial / LPF: NONE SEEN

## 2013-12-01 LAB — URINE CULTURE: Colony Count: 40000

## 2013-12-03 NOTE — Progress Notes (Signed)
Reviewed personally.  M. Suzanne Philamena Kramar, MD.  

## 2013-12-04 LAB — IPS PAP TEST WITH REFLEX TO HPV

## 2014-01-24 ENCOUNTER — Other Ambulatory Visit: Payer: Self-pay

## 2014-01-24 DIAGNOSIS — Z1231 Encounter for screening mammogram for malignant neoplasm of breast: Secondary | ICD-10-CM

## 2014-01-30 ENCOUNTER — Ambulatory Visit
Admission: RE | Admit: 2014-01-30 | Discharge: 2014-01-30 | Disposition: A | Discharge status: Home / Self Care | Payer: BC Managed Care – PPO | Source: Ambulatory Visit

## 2014-01-30 DIAGNOSIS — Z1231 Encounter for screening mammogram for malignant neoplasm of breast: Secondary | ICD-10-CM

## 2014-04-01 ENCOUNTER — Encounter: Payer: Self-pay | Admitting: Certified Nurse Midwife

## 2014-12-10 ENCOUNTER — Ambulatory Visit (INDEPENDENT_AMBULATORY_CARE_PROVIDER_SITE_OTHER): Payer: BLUE CROSS/BLUE SHIELD | Admitting: Certified Nurse Midwife

## 2014-12-10 ENCOUNTER — Encounter: Payer: Self-pay | Admitting: Certified Nurse Midwife

## 2014-12-10 VITALS — BP 112/64 | HR 70 | Resp 16 | Ht 67.25 in | Wt 166.0 lb

## 2014-12-10 DIAGNOSIS — Z0000 Encounter for general adult medical examination without abnormal findings: Secondary | ICD-10-CM

## 2014-12-10 DIAGNOSIS — Z01419 Encounter for gynecological examination (general) (routine) without abnormal findings: Secondary | ICD-10-CM

## 2014-12-10 LAB — POCT URINALYSIS DIPSTICK
Bilirubin, UA: NEGATIVE
Glucose, UA: NEGATIVE
Ketones, UA: NEGATIVE
LEUKOCYTES UA: NEGATIVE
Nitrite, UA: NEGATIVE
PH UA: 5
Protein, UA: NEGATIVE
RBC UA: NEGATIVE
UROBILINOGEN UA: NEGATIVE

## 2014-12-10 NOTE — Patient Instructions (Signed)

## 2014-12-10 NOTE — Progress Notes (Signed)
Reviewed personally.  M. Suzanne Jacquise Rarick, MD.  

## 2014-12-10 NOTE — Progress Notes (Signed)
49 y.o. [id number] Married  Caucasian Fe here for annual exam. Periods scant with Mirena IUD. Had spotting yesterday only. Still recovering from fall from horse last year, now doing dry needle PT, which is working. Sees Urgent care if needed. Declines labs today, will do with next aex. IUD due for removal in 2017, not sure if she wants replacement. No hot flashes or night sweats. No other health issues today.  No LMP recorded. Patient is not currently having periods (Reason: IUD).       LMP 12-09-14   Sexually active: Yes.    The current method of family planning is IUD.    Exercising: Yes.    walking,swimming,yoga Smoker:  no  Health Maintenance: Pap: 11-28-13 neg MMG: 01-30-14 category c density, birads 1:neg 3D done aware of recommendation for yearly 3 D Colonoscopy: 2011 f/u 8012yrs due had polyps BMD:   none TDaP:  2009 Labs: poct neg Self breast exam: done monthly   reports that she has quit smoking. She does not have any smokeless tobacco history on file. She reports that she drinks about 6.0 oz of alcohol per week. She reports that she does not use illicit drugs.  History reviewed. No pertinent past medical history.  Past Surgical History  Procedure Laterality Date  . Appendectomy    . Cesarean section    . Intrauterine device (iud) insertion      mirena inserted 06-05-10, due out 1/17    Current Outpatient Prescriptions  Medication Sig Dispense Refill  . levonorgestrel (MIRENA) 20 MCG/24HR IUD 1 each by Intrauterine route once.     No current facility-administered medications for this visit.    Family History  Problem Relation Age of Onset  . Hypertension Mother   . Diabetes Father   . Coronary artery disease Father   . Colon cancer Father   . Cancer Father     prostate  . Emphysema Maternal Grandfather   . Cancer Paternal Grandmother   . Coronary artery disease Paternal Grandfather     ROS:  Pertinent items are noted in HPI.  Otherwise, a comprehensive ROS was  negative.  Exam:   BP 112/64 mmHg  Pulse 70  Resp 16  Ht 5' 7.25" (1.708 m)  Wt 166 lb (75.297 kg)  BMI 25.81 kg/m2 Height: 5' 7.25" (170.8 cm) Ht Readings from Last 3 Encounters:  12/10/14 5' 7.25" (1.708 m)  11/28/13 5' 7.25" (1.708 m)  11/27/12 5' 7.25" (1.708 m)    General appearance: alert, cooperative and appears stated age Head: Normocephalic, without obvious abnormality, atraumatic Neck: no adenopathy, supple, symmetrical, trachea midline and thyroid normal to inspection and palpation Lungs: clear to auscultation bilaterally Breasts: normal appearance, no masses or tenderness, No nipple retraction or dimpling, No nipple discharge or bleeding, No axillary or supraclavicular adenopathy Heart: regular rate and rhythm Abdomen: soft, non-tender; no masses,  no organomegaly Extremities: extremities normal, atraumatic, no cyanosis or edema Skin: Skin color, texture, turgor normal. No rashes or lesions Lymph nodes: Cervical, supraclavicular, and axillary nodes normal. No abnormal inguinal nodes palpated Neurologic: Grossly normal   Pelvic: External genitalia:  no lesions              Urethra:  normal appearing urethra with no masses, tenderness or lesions              Bartholin's and Skene's: normal                 Vagina: normal appearing vagina with normal color and  discharge, no lesions              Cervix: normal, non tender, IUD string noted              Pap taken: No. Bimanual Exam:  Uterus:  normal size, contour, position, consistency, mobility, non-tender              Adnexa: normal adnexa and no mass, fullness, tenderness               Rectovaginal: Confirms               Anus:  normal sphincter tone, no lesions  Chaperone present: Yes  A:  Well Woman with normal exam  Contraception Mirena IUD due for removal 2017  Colonoscopy due this year, had previous history of polyps  Back injury still under treatment with PT  P:   Reviewed health and wellness pertinent to  exam  Aware of warning signs of IUD, discussed continued use for contraception if not menopausal and if menopausal can be used with estrogen as HRT. Will plan to do [facility] and [facility] in  December prior to removal and discuss.   Patient has scheduled, ? Date.  Continue follow up as indicated  Fasting labs with next aex  Pap smear not taken today.  Discussed importance of exercise as tolerated, calcium, Vit. D and mammograms yearly,counseled on breast self exam monthly.  return annually or prn  An After Visit Summary was printed and given to the patient.

## 2014-12-11 ENCOUNTER — Telehealth: Payer: Self-pay | Admitting: Certified Nurse Midwife

## 2014-12-11 NOTE — Telephone Encounter (Signed)
LMTCB ( call regarding IUD insertion date of 06-06-15)

## 2014-12-12 NOTE — Telephone Encounter (Signed)
Spoke with patient, given information that IUD needs removal 06-06-15, if she plans reinsertion [facility] to [facility]. Recommend she come in December for this. Order in. Will advise per results. Patient will put on her calendar.

## 2015-04-15 ENCOUNTER — Other Ambulatory Visit: Payer: Self-pay

## 2015-04-15 DIAGNOSIS — Z1231 Encounter for screening mammogram for malignant neoplasm of breast: Secondary | ICD-10-CM

## 2015-05-29 ENCOUNTER — Ambulatory Visit: Payer: Self-pay

## 2015-07-09 ENCOUNTER — Ambulatory Visit
Admission: RE | Admit: 2015-07-09 | Discharge: 2015-07-09 | Disposition: A | Discharge status: Home / Self Care | Payer: BLUE CROSS/BLUE SHIELD | Source: Ambulatory Visit

## 2015-07-09 DIAGNOSIS — Z1231 Encounter for screening mammogram for malignant neoplasm of breast: Secondary | ICD-10-CM

## 2015-09-24 ENCOUNTER — Telehealth: Payer: Self-pay | Admitting: Certified Nurse Midwife

## 2015-09-24 DIAGNOSIS — Z0189 Encounter for other specified special examinations: Secondary | ICD-10-CM

## 2015-09-24 DIAGNOSIS — N951 Menopausal and female climacteric states: Secondary | ICD-10-CM

## 2015-09-24 NOTE — Telephone Encounter (Signed)
Spoke with patient. Patient has a Mirena IUD in place that was due for removal by 06/06/2015. Patient states she spoke with Leota Sauerseborah Leonard CNM about having labs performed prior to IUD removal and reinsertion to see if she is in menopause (please see note below). Lab appointment scheduled for 09/25/2015 at 4 pm. She is agreeable to date and time.  Verner Choleborah S Leonard, CNM at 12/12/2014 12:55 PM     Status: Signed       Expand All Collapse All   Spoke with patient, given information that IUD needs removal 06-06-15, if she plans reinsertion [facility]. Recommend she come in December for this. Order in. Will advise per results. Patient will put on her calendar.            Verner Choleborah S Leonard, CNM at 12/11/2014 1:24 PM     Status: Signed       Expand All Collapse All   LMTCB ( call regarding IUD insertion date of 06-06-15)       Leota Sauerseborah Leonard CNM what diagnosis would you like me to associate with her lab orders?

## 2015-09-24 NOTE — Telephone Encounter (Signed)
agree

## 2015-09-24 NOTE — Telephone Encounter (Signed)
Patient needs to speak with the nurse. She called and said, "I have a Mirena and it has been in five years. I am not sure if I am in menopause, though, and may need some lab work to determine whether I should have a new Mirena inserted. I can't remember what Debbi said to do."

## 2015-09-25 ENCOUNTER — Other Ambulatory Visit (INDEPENDENT_AMBULATORY_CARE_PROVIDER_SITE_OTHER): Payer: BLUE CROSS/BLUE SHIELD

## 2015-09-25 DIAGNOSIS — N951 Menopausal and female climacteric states: Secondary | ICD-10-CM

## 2015-09-26 LAB — FOLLICLE STIMULATING HORMONE: FSH: 10.7 m[IU]/mL

## 2015-09-30 LAB — ANTI MULLERIAN HORMONE

## 2015-10-01 ENCOUNTER — Telehealth: Payer: Self-pay

## 2015-10-01 NOTE — Telephone Encounter (Signed)
-----   Message from Verner Choleborah S Leonard, CNM sent at 10/01/2015  3:07 PM EDT ----- Notify patient that [facility] not [facility], so she still needs contraception and will need IUD replaced.

## 2015-10-01 NOTE — Telephone Encounter (Signed)
Becky Lopez CNM, patients IUD expired in 06/2015. She is not currently having cycles with IUD in place. Please advise on how to proceed with scheduling IUD removal and insertion.

## 2015-10-07 NOTE — Telephone Encounter (Signed)
Will need to have serum pregnancy test scheduled and repeat in 2 weeks. If Negative needs to insertion scheduled as soon as possible after second negative test. She needs to use consistent condoms until that another IUD is inserted.

## 2015-10-07 NOTE — Telephone Encounter (Signed)
Spoke with patient. Advised of message and results as seen below from Leota Sauerseborah Leonard CNM. She is agreeable and verbalizes understanding. First lab appointment scheduled for 10/08/2015 at 10 am. Second lab scheduled for 10/22/2015 at 8:40 am. She is aware she will need to have consistent condom use until another IUD is inserted.  Routing to provider for final review. Patient agreeable to disposition. Will close encounter.

## 2015-10-08 ENCOUNTER — Other Ambulatory Visit (INDEPENDENT_AMBULATORY_CARE_PROVIDER_SITE_OTHER): Payer: BLUE CROSS/BLUE SHIELD

## 2015-10-08 DIAGNOSIS — N912 Amenorrhea, unspecified: Secondary | ICD-10-CM

## 2015-10-09 LAB — HCG, SERUM, QUALITATIVE: PREG SERUM: NEGATIVE

## 2015-10-11 LAB — ANTI MULLERIAN HORMONE: AMH AssessR: <0.03 ng/mL

## 2015-10-15 ENCOUNTER — Telehealth: Payer: Self-pay | Admitting: Emergency Medicine

## 2015-10-15 DIAGNOSIS — N912 Amenorrhea, unspecified: Secondary | ICD-10-CM

## 2015-10-15 NOTE — Telephone Encounter (Signed)
Message left to return call to Talyah Seder at [phone] or triage nurse at [phone] 

## 2015-10-15 NOTE — Telephone Encounter (Signed)
Notes Recorded by Verner Choleborah S Leonard, CNM on 10/13/2015 at 7:52 PM Notify patient Serum HCG is negative and AMH is low indicating possible infertility but FSH was low so would proceed as discussed with IUD insertion after second serum HCG scheduled

## 2015-10-22 ENCOUNTER — Other Ambulatory Visit (INDEPENDENT_AMBULATORY_CARE_PROVIDER_SITE_OTHER): Payer: BLUE CROSS/BLUE SHIELD

## 2015-10-22 DIAGNOSIS — N912 Amenorrhea, unspecified: Secondary | ICD-10-CM

## 2015-10-23 LAB — HCG, SERUM, QUALITATIVE: Preg, Serum: NEGATIVE

## 2015-10-29 ENCOUNTER — Telehealth: Payer: Self-pay | Admitting: Emergency Medicine

## 2015-10-29 DIAGNOSIS — Z30433 Encounter for removal and reinsertion of intrauterine contraceptive device: Secondary | ICD-10-CM

## 2015-10-29 NOTE — Telephone Encounter (Signed)
-----   Message from Verner Choleborah S Leonard, CNM sent at 10/23/2015  3:04 AM EDT ----- Notify serum preg. Test negative, Can schedule for IUD replacement. Use continuous condoms until inserted

## 2015-10-29 NOTE — Telephone Encounter (Signed)
Spoke with patient and advised of results. She is aware to use continuous condoms and verbalized understanding. She is scheduled for IUD removal/reinsertion with Leota Sauerseborah Leonard CNM on 11/05/15 at 1400.  Pre procedure instructions given.  Motrin 800 mg one hour before appointment. Eat a meal and hydrate well before appointment. Patient verbalized understanding.  Orders placed for pre certification of removal and reinsertion of IUD.  Routing to provider for final review. Patient agreeable to disposition. Will close encounter.

## 2015-11-05 ENCOUNTER — Encounter: Payer: Self-pay | Admitting: Certified Nurse Midwife

## 2015-11-05 ENCOUNTER — Ambulatory Visit (INDEPENDENT_AMBULATORY_CARE_PROVIDER_SITE_OTHER): Payer: BLUE CROSS/BLUE SHIELD | Admitting: Certified Nurse Midwife

## 2015-11-05 VITALS — BP 102/60 | HR 70 | Resp 16 | Ht 67.25 in | Wt 165.0 lb

## 2015-11-05 DIAGNOSIS — N912 Amenorrhea, unspecified: Secondary | ICD-10-CM | POA: Diagnosis not present

## 2015-11-05 DIAGNOSIS — Z30433 Encounter for removal and reinsertion of intrauterine contraceptive device: Secondary | ICD-10-CM

## 2015-11-05 LAB — POCT URINE PREGNANCY: PREG TEST UR: NEGATIVE

## 2015-11-05 NOTE — Patient Instructions (Signed)

## 2015-11-05 NOTE — Progress Notes (Signed)
[age] yrs Married Caucasian female presents for Mirena removal and insertion of new Mirena IUD. Denies any vaginal symptoms or pelvic pain or STD concerns . LMPJune  7, 2017.  POCT UPT negative  Patient was given information regarding Mirena IUD and discussed.. Information read regarding Mirena  IUD, consent signed. Questions addressed.  Request same as above.   Healthy WDWN female  Orientation X 3 Affect normal Abdomen: soft, non-tender Groin: no enlarged lymph nodes  Pelvic exam: Vulva;normal female genitalia, Bartholin's, Urethra, Skene normal, no lesions Vagina:normal vagina, no discharge, exudate, lesion, or erythema Cervix:Non-tender, Negative CMT, no lesions or redness, parous os with IUD string noted in cervix Uterus:normal shape, position and consistency, anteverted  Adnexa: normal,no masses or fullness   Procedure:  Speculum inserted into vagina. Cervix visualized and cleansed with Betadine solution.Toney Reil. Mirena IUD string visualized,  and grasped with ring forceps.  With gentle traction IUD removed intact, without difficulty, and shown to patient and discarded. Cervix cleansed with betadine solution X 3. Tenaculum placed on cervix at 2 and 10 o'clock position(s).  Uterus sounded to 7 1/2 centimeters.  Mirena IUD removed from sterile packet and under sterile conditions inserted with out difficulty.  IUD string trimmed to 3 centimeters.  Remainder string given to patient to feel for identification.  Tenaculum removed. some bleeding noted. Monsel's applied. No bleeding noted on speculum removal  Uterus palpated normal.  Patient tolerated procedure well.  A: Mirena IUD removal and insertion of new Mirena, Lot # [id number], Expiration date 1/20.   P: Instructions and warnings signs given regarding new IUD.     IUD identification card given with IUD removal 11/04/2020      Return visit one month  For recheck of IUD. prn

## 2015-11-07 NOTE — Progress Notes (Signed)
Reviewed personally.  M. Suzanne Melane Windholz, MD.  

## 2015-12-03 ENCOUNTER — Ambulatory Visit: Payer: BLUE CROSS/BLUE SHIELD | Admitting: Certified Nurse Midwife

## 2015-12-04 ENCOUNTER — Ambulatory Visit (INDEPENDENT_AMBULATORY_CARE_PROVIDER_SITE_OTHER): Payer: BLUE CROSS/BLUE SHIELD | Admitting: Certified Nurse Midwife

## 2015-12-04 ENCOUNTER — Encounter: Payer: Self-pay | Admitting: Certified Nurse Midwife

## 2015-12-04 VITALS — BP 110/64 | HR 68 | Resp 16 | Ht 67.25 in | Wt 168.0 lb

## 2015-12-04 DIAGNOSIS — Z30431 Encounter for routine checking of intrauterine contraceptive device: Secondary | ICD-10-CM

## 2015-12-04 NOTE — Patient Instructions (Signed)
Intrauterine Device Information An intrauterine device (IUD) is inserted into your uterus to prevent pregnancy. There are two types of IUDs available:   Copper IUD--This type of IUD is wrapped in copper wire and is placed inside the uterus. Copper makes the uterus and fallopian tubes produce a fluid that kills sperm. The copper IUD can stay in place for 10 years.  Hormone IUD--This type of IUD contains the hormone progestin (synthetic progesterone). The hormone thickens the cervical mucus and prevents sperm from entering the uterus. It also thins the uterine lining to prevent implantation of a fertilized egg. The hormone can weaken or kill the sperm that get into the uterus. One type of hormone IUD can stay in place for 5 years, and another type can stay in place for 3 years. Your health care provider will make sure you are a good candidate for a contraceptive IUD. Discuss with your health care provider the possible side effects.  ADVANTAGES OF AN INTRAUTERINE DEVICE  IUDs are highly effective, reversible, long acting, and low maintenance.   There are no estrogen-related side effects.   An IUD can be used when breastfeeding.   IUDs are not associated with weight gain.   The copper IUD works immediately after insertion.   The hormone IUD works right away if inserted within 7 days of your period starting. You will need to use a backup method of birth control for 7 days if the hormone IUD is inserted at any other time in your cycle.  The copper IUD does not interfere with your female hormones.   The hormone IUD can make heavy menstrual periods lighter and decrease cramping.   The hormone IUD can be used for 3 or 5 years.   The copper IUD can be used for 10 years. DISADVANTAGES OF AN INTRAUTERINE DEVICE  The hormone IUD can be associated with irregular bleeding patterns.   The copper IUD can make your menstrual flow heavier and more painful.   You may experience cramping and  vaginal bleeding after insertion.    This information is not intended to replace advice given to you by your health care provider. Make sure you discuss any questions you have with your health care provider.   Document Released: 04/20/2004 Document Revised: 01/17/2013 Document Reviewed: 11/05/2012 Elsevier Interactive Patient Education 2016 Elsevier Inc.  

## 2015-12-04 NOTE — Progress Notes (Signed)
Encounter reviewed Jill Jertson, MD   

## 2015-12-04 NOTE — Progress Notes (Signed)
50 y.o. Married Caucasian female [id number] here for follow up of IUD insertion  11/05/15. Patient has not tried to feel string. Denies any vaginal bleeding except after insertion and minimal cramping. No concerns today regarding. Happy with choice. Spouse unable to feel string.  O: Healthy WD,WN female Affect: normal Skin:warm and dry Abdomen:soft, non tender Pelvic exam:EXTERNAL GENITALIA: normal appearing vulva with no masses, tenderness or lesions VAGINA: no abnormal discharge or lesions CERVIX: no lesions or cervical motion tenderness and IUD string noted in cervical os with length at 2 -3 cm appropriate UTERUS: normal, non tender, no masses ADNEXA: no masses palpable and nontender  A:Normal IUD surveillance after insertion Normal pelvic exam  P: Discussed findings of normal IUD surveillance and normal pelvic exam. Warning signs of IUD reviewed and need to advise. Questions addressed.   RV prn, aex

## 2015-12-19 ENCOUNTER — Encounter: Payer: Self-pay | Admitting: Certified Nurse Midwife

## 2015-12-19 ENCOUNTER — Ambulatory Visit (INDEPENDENT_AMBULATORY_CARE_PROVIDER_SITE_OTHER): Payer: BLUE CROSS/BLUE SHIELD | Admitting: Certified Nurse Midwife

## 2015-12-19 VITALS — BP 102/60 | HR 76 | Resp 14 | Ht 67.25 in | Wt 164.0 lb

## 2015-12-19 DIAGNOSIS — Z0000 Encounter for general adult medical examination without abnormal findings: Secondary | ICD-10-CM

## 2015-12-19 DIAGNOSIS — Z01419 Encounter for gynecological examination (general) (routine) without abnormal findings: Secondary | ICD-10-CM

## 2015-12-19 DIAGNOSIS — Z1151 Encounter for screening for human papillomavirus (HPV): Secondary | ICD-10-CM | POA: Diagnosis not present

## 2015-12-19 DIAGNOSIS — Z124 Encounter for screening for malignant neoplasm of cervix: Secondary | ICD-10-CM

## 2015-12-19 DIAGNOSIS — N63 Unspecified lump in breast: Secondary | ICD-10-CM | POA: Diagnosis not present

## 2015-12-19 DIAGNOSIS — N6320 Unspecified lump in the left breast, unspecified quadrant: Secondary | ICD-10-CM

## 2015-12-19 LAB — COMPREHENSIVE METABOLIC PANEL
ALK PHOS: 41 U/L (ref 33–130)
ALT: 16 U/L (ref 6–29)
AST: 15 U/L (ref 10–35)
Albumin: 4 g/dL (ref 3.6–5.1)
BUN: 16 mg/dL (ref 7–25)
CALCIUM: 8.6 mg/dL (ref 8.6–10.4)
CHLORIDE: 103 mmol/L (ref 98–110)
CO2: 25 mmol/L (ref 20–31)
Creat: 0.79 mg/dL (ref 0.50–1.05)
GLUCOSE: 85 mg/dL (ref 65–99)
POTASSIUM: 4.3 mmol/L (ref 3.5–5.3)
Sodium: 138 mmol/L (ref 135–146)
TOTAL PROTEIN: 6.4 g/dL (ref 6.1–8.1)
Total Bilirubin: 0.8 mg/dL (ref 0.2–1.2)

## 2015-12-19 LAB — LIPID PANEL
Cholesterol: 179 mg/dL (ref 125–200)
HDL: 70 mg/dL (ref >=46)
LDL Cholesterol: 97 mg/dL (ref <130)
TRIGLYCERIDES: 60 mg/dL (ref <150)
Total CHOL/HDL Ratio: 2.6 Ratio (ref <=5.0)
VLDL: 12 mg/dL (ref <30)

## 2015-12-19 LAB — TSH: TSH: 0.92 mIU/L

## 2015-12-19 NOTE — Patient Instructions (Signed)

## 2015-12-19 NOTE — Progress Notes (Signed)
50 y.o. Becky Lopez Married  Caucasian Fe here for annual exam. Periods none since new IUD insertion, no problems with  IUD. 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 Sees Urgent care if needed. No health issues today. Daughter(autistic) made through overnight camp!   No LMP recorded. Patient is not currently having periods (Reason: IUD).          Sexually active: Yes.    The current method of family planning is IUD.    Exercising: Yes.    Swim, walk, yoga Smoker:  no  Health Maintenance: Pap:  11/28/13 WNL MMG: 07/09/15 BIRADS1 Colonoscopy:  01/22/15 Removed 1 polyp. Repeat in 5 years.  TDaP:  06/15/2007 Hep C and HIV: No Labs: blood drawn today.    reports that she has quit smoking. She does not have any smokeless tobacco history on file. She reports that she drinks about 6.0 oz of alcohol per week. She reports that she does not use illicit drugs.  Past Medical History  Diagnosis Date  . Abnormal Pap smear of cervix     many yrs ago    Past Surgical History  Procedure Laterality Date  . Appendectomy    . Cesarean section    . Intrauterine device (iud) insertion      mirena inserted 06-05-10, due out 1/17, new mirena inserted 11/05/15    Current Outpatient Prescriptions  Medication Sig Dispense Refill  . levonorgestrel (MIRENA) 20 MCG/24HR IUD 1 each by Intrauterine route once.     No current facility-administered medications for this visit.    Family History  Problem Relation Age of Onset  . Hypertension Mother   . Diabetes Father   . Coronary artery disease Father   . Colon cancer Father   . Cancer Father     prostate  . Emphysema Maternal Grandfather   . Cancer Paternal Grandmother   . Coronary artery disease Paternal Grandfather   . Hypertension Sister     ROS:  Pertinent items are noted in HPI.  Otherwise, a comprehensive ROS was negative.  Exam:   BP 102/60 mmHg  Pulse 76  Resp 14  Ht 5' 7.25" (1.708 m)  Wt 164 lb (74.39 kg)  BMI 25.50 kg/m2 Height: 5'  7.25" (170.8 cm) Ht Readings from Last 3 Encounters:  12/19/15 5' 7.25" (1.708 m)  12/04/15 5' 7.25" (1.708 m)  11/05/15 5' 7.25" (1.708 m)    General appearance: alert, cooperative and appears stated age Head: Normocephalic, without obvious abnormality, atraumatic Neck: no adenopathy, supple, symmetrical, trachea midline and thyroid normal to inspection and palpation Lungs: clear to auscultation bilaterally Breasts: normal appearance, no masses or tenderness, positive findings: left breast BB size mass at 12 o'clock at edge of aerola, mobile, non tender, semi firm Heart: regular rate and rhythm Abdomen: soft, non-tender; no masses,  no organomegaly Extremities: extremities normal, atraumatic, no cyanosis or edema Skin: Skin color, texture, turgor normal. No rashes or lesions Lymph nodes: Cervical, supraclavicular, and axillary nodes normal. No abnormal inguinal nodes palpated Neurologic: Grossly normal   Pelvic: External genitalia:  no lesions              Urethra:  normal appearing urethra with no masses, tenderness or lesions              Bartholin's and Skene's: normal                 Vagina: normal appearing vagina with normal color and discharge, no lesions  Cervix: multiparous appearance, no bleeding following Pap, no cervical motion tenderness, no lesions and IUD string noted in cervix              Pap taken: Yes.   Bimanual Exam:  Uterus:  normal size, contour, position, consistency, mobility, non-tender              Adnexa: normal adnexa and no mass, fullness, tenderness               Rectovaginal: Confirms               Anus:  normal sphincter tone, no lesions  Chaperone present: yes  A:  Well Woman with normal exam  Contraception Mirena IUD  Left breast mass  Screening labs  P:   Reviewed health and wellness pertinent to exam  Reviewed warning signs and symptoms of IUD and need to advise if occurs.  Discussed findings of left breast and had patient  feel area. Discussed need for evaluation with diagnostic mammogram and US. Agreeable. She will be scheduled before leaving today. Will recheck breast once results in.  Labs: Hep C, Vitamin d, TSH, Lipid panel, CMP  Pap smear as above with HPVHR   counseled on breast self exam, mammography screening, adequate intake of calcium and vitamin D, diet and exercise  return annually or prn  An After Visit Summary was printed and given to the patient.

## 2015-12-19 NOTE — Progress Notes (Signed)
Scheduled patient [facility] left [facility] at 10:30 am. She is agreeable to date and time. 6 week recheck with Leota Sauerseborah Leonard CNM scheduled for 01/27/2016 at 8:45 am. Placed in mammogram hold.

## 2015-12-20 LAB — HEPATITIS C ANTIBODY: HCV AB: NEGATIVE

## 2015-12-20 LAB — VITAMIN D 25 HYDROXY (VIT D DEFICIENCY, FRACTURES): VIT D 25 HYDROXY: 46 ng/mL (ref 30–100)

## 2015-12-23 LAB — IPS PAP TEST WITH HPV

## 2015-12-24 NOTE — Progress Notes (Signed)
Encounter reviewed Ala Capri, MD   

## 2015-12-25 ENCOUNTER — Ambulatory Visit
Admission: RE | Admit: 2015-12-25 | Discharge: 2015-12-25 | Disposition: A | Discharge status: Home / Self Care | Payer: BLUE CROSS/BLUE SHIELD | Source: Ambulatory Visit | Attending: Certified Nurse Midwife | Admitting: Certified Nurse Midwife

## 2015-12-25 DIAGNOSIS — N6320 Unspecified lump in the left breast, unspecified quadrant: Secondary | ICD-10-CM

## 2015-12-25 DIAGNOSIS — R922 Inconclusive mammogram: Secondary | ICD-10-CM | POA: Diagnosis not present

## 2015-12-25 DIAGNOSIS — N6489 Other specified disorders of breast: Secondary | ICD-10-CM | POA: Diagnosis not present

## 2015-12-26 ENCOUNTER — Telehealth: Payer: Self-pay | Admitting: Certified Nurse Midwife

## 2015-12-26 NOTE — Telephone Encounter (Signed)
Left message regarding upcoming 6 week recheck appointment 01/27/16 with DL has been canceled and needs to be rescheduled.

## 2016-01-01 NOTE — Telephone Encounter (Signed)
Left another message for patient to call and reschedule 6 week recheck with Leota Sauers, CNM. Leota Sauers, CNM will be out of the office

## 2016-01-05 NOTE — Telephone Encounter (Signed)
Okay to close encounter?  °

## 2016-01-06 NOTE — Telephone Encounter (Signed)
Joy can you work on this

## 2016-01-07 NOTE — Telephone Encounter (Signed)
I will try & contact patient

## 2016-01-08 NOTE — Telephone Encounter (Signed)
appt for breast recheck is 02-04-16 at 10am

## 2016-01-27 ENCOUNTER — Ambulatory Visit: Payer: BLUE CROSS/BLUE SHIELD | Admitting: Certified Nurse Midwife

## 2016-02-04 ENCOUNTER — Ambulatory Visit (INDEPENDENT_AMBULATORY_CARE_PROVIDER_SITE_OTHER): Payer: BLUE CROSS/BLUE SHIELD | Admitting: Certified Nurse Midwife

## 2016-02-04 ENCOUNTER — Encounter: Payer: Self-pay | Admitting: Certified Nurse Midwife

## 2016-02-04 VITALS — BP 108/68 | HR 72 | Resp 16 | Ht 67.25 in | Wt 166.0 lb

## 2016-02-04 DIAGNOSIS — N6459 Other signs and symptoms in breast: Secondary | ICD-10-CM

## 2016-02-04 NOTE — Progress Notes (Signed)
   Subjective:   50 y.o. MarriedCaucasian female presents for follow up of left breast mass, noted on aex on 12/19/15. Patient had diagnostic mammogram and us on 12/25/15. Benign findings were noted with fibroglandular tissue noted. Category C density. Patient is unable to feel area now.  Review of Systems Pertinent items are noted in HPI.   Objective:   General appearance: alert, cooperative and appears stated age Head:  Breasts: normal appearance, no masses or tenderness, No nipple retraction or dimpling, No nipple discharge or bleeding, No axillary or supraclavicular adenopathy, no masses noted bilateral, area of concern no mass palpated   Assessment:   ASSESSMENT:Normal breast exam with fibroglandular tissue noted on diagnostic mammogram and US and on recheck breast exam Plan:   PLAN: Discussed finding with patient and no palpable area today. Feel fibroglandular tissue is what we noted.  Continue SBE and annual mammogram. Come in for recheck if change noted. Patient agreeable.

## 2016-02-09 NOTE — Progress Notes (Signed)
Encounter reviewed Becky Grenier, MD   

## 2016-09-13 ENCOUNTER — Other Ambulatory Visit: Payer: Self-pay | Admitting: Certified Nurse Midwife

## 2016-09-13 DIAGNOSIS — Z1231 Encounter for screening mammogram for malignant neoplasm of breast: Secondary | ICD-10-CM

## 2016-10-06 ENCOUNTER — Ambulatory Visit
Admission: RE | Admit: 2016-10-06 | Discharge: 2016-10-06 | Disposition: A | Discharge status: Home / Self Care | Payer: BLUE CROSS/BLUE SHIELD | Source: Ambulatory Visit | Attending: Certified Nurse Midwife | Admitting: Certified Nurse Midwife

## 2016-10-06 DIAGNOSIS — Z1231 Encounter for screening mammogram for malignant neoplasm of breast: Secondary | ICD-10-CM | POA: Diagnosis not present

## 2016-12-21 ENCOUNTER — Encounter: Payer: Self-pay | Admitting: Certified Nurse Midwife

## 2016-12-21 ENCOUNTER — Ambulatory Visit (INDEPENDENT_AMBULATORY_CARE_PROVIDER_SITE_OTHER): Payer: BLUE CROSS/BLUE SHIELD | Admitting: Certified Nurse Midwife

## 2016-12-21 VITALS — BP 108/74 | HR 70 | Resp 16 | Ht 67.25 in | Wt 167.0 lb

## 2016-12-21 DIAGNOSIS — Z30431 Encounter for routine checking of intrauterine contraceptive device: Secondary | ICD-10-CM

## 2016-12-21 DIAGNOSIS — Z01419 Encounter for gynecological examination (general) (routine) without abnormal findings: Secondary | ICD-10-CM | POA: Diagnosis not present

## 2016-12-21 NOTE — Progress Notes (Signed)
51 y.o. [id number] Married  Caucasian Fe here for annual exam. IUD working well. No bleeding or increase discharge. Staying active. Daughter (autism) 16 went to camp for 4713 days/nights!. Denies hot flashes or night sweats or vaginal dryness issues. 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  Sees Urgent care if needed. No other health issues today.  No LMP recorded. Patient is not currently having periods (Reason: IUD).          Sexually active: Yes.    The current method of family planning is IUD.    Exercising: Yes.    swim, run & yoga Smoker:  no  Health Maintenance: Pap:  11-28-13 neg, 12-19-15 neg HPV HR neg History of Abnormal Pap: yes,7874yrs ago MMG:  10-06-16 category c density birads 1:neg Self Breast exams: yes Colonoscopy:  2016 1 polyp f/u 946yrs BMD:   none TDaP:  2009 Shingles: no Pneumonia: no Hep C and HIV: Hep c neg 2017, HIV neg yrs ago Labs: none   reports that she has quit smoking. She has never used smokeless tobacco. She reports that she drinks about 6.0 oz of alcohol per week . She reports that she does not use drugs.  Past Medical History:  Diagnosis Date  . Abnormal Pap smear of cervix    many yrs ago    Past Surgical History:  Procedure Laterality Date  . APPENDECTOMY    . CESAREAN SECTION    . INTRAUTERINE DEVICE (IUD) INSERTION     mirena inserted 06-05-10, due out 1/17, new mirena inserted 11/05/15    Current Outpatient Prescriptions  Medication Sig Dispense Refill  . levonorgestrel (MIRENA) 20 MCG/24HR IUD 1 each by Intrauterine route once.     No current facility-administered medications for this visit.     Family History  Problem Relation Age of Onset  . Diabetes Father   . Coronary artery disease Father   . Colon cancer Father   . Cancer Father        prostate  . Hypertension Mother   . Emphysema Maternal Grandfather   . Cancer Paternal Grandmother   . Coronary artery disease Paternal Grandfather   . Hypertension Sister      ROS:  Pertinent items are noted in HPI.  Otherwise, a comprehensive ROS was negative.  Exam:   BP 108/74   Pulse 70   Resp 16   Ht 5' 7.25" (1.708 m)   Wt 167 lb (75.8 kg)   BMI 25.96 kg/m  Height: 5' 7.25" (170.8 cm) Ht Readings from Last 3 Encounters:  12/21/16 5' 7.25" (1.708 m)  02/04/16 5' 7.25" (1.708 m)  12/19/15 5' 7.25" (1.708 m)    General appearance: alert, cooperative and appears stated age Head: Normocephalic, without obvious abnormality, atraumatic Neck: no adenopathy, supple, symmetrical, trachea midline and thyroid normal to inspection and palpation Lungs: clear to auscultation bilaterally Breasts: normal appearance, no masses or tenderness, No nipple retraction or dimpling, No nipple discharge or bleeding, No axillary or supraclavicular adenopathy Heart: regular rate and rhythm Abdomen: soft, non-tender; no masses,  no organomegaly Extremities: extremities normal, atraumatic, no cyanosis or edema Skin: Skin color, texture, turgor normal. No rashes or lesions Lymph nodes: Cervical, supraclavicular, and axillary nodes normal. No abnormal inguinal nodes palpated Neurologic: Grossly normal   Pelvic: External genitalia:  no lesions              Urethra:  normal appearing urethra with no masses, tenderness or lesions  Bartholin's and Skene's: normal                 Vagina: normal appearing vagina with normal color and discharge, no lesions              Cervix: multiparous appearance, no cervical motion tenderness, no lesions and iud string noted in cervical os              Pap taken: No. Bimanual Exam:  Uterus:  normal size, contour, position, consistency, mobility, non-tender              Adnexa: normal adnexa and no mass, fullness, tenderness               Rectovaginal: Confirms               Anus:  normal sphincter tone, no lesions  Chaperone present: yes  A:  Well Woman with normal exam  Contraception Mirena IUD due for removal  11/14/20  Social stress with Autistic daughter, but doing well    P:   Reviewed health and wellness pertinent to exam  Warning signs of IUD reviewed and need to advise if occurs. Aware of bleeding profile expectations  Continue to draw support from friends and family  Pap smear: no   counseled on breast self exam, mammography screening, adequate intake of calcium and vitamin D, diet and exercise  return annually or prn  An After Visit Summary was printed and given to the patient.

## 2016-12-21 NOTE — Patient Instructions (Signed)

## 2017-09-07 DIAGNOSIS — R002 Palpitations: Secondary | ICD-10-CM | POA: Diagnosis not present

## 2017-09-07 DIAGNOSIS — N911 Secondary amenorrhea: Secondary | ICD-10-CM | POA: Diagnosis not present

## 2017-09-07 DIAGNOSIS — Z6826 Body mass index (BMI) 26.0-26.9, adult: Secondary | ICD-10-CM | POA: Diagnosis not present

## 2017-09-07 DIAGNOSIS — R030 Elevated blood-pressure reading, without diagnosis of hypertension: Secondary | ICD-10-CM | POA: Diagnosis not present

## 2017-09-29 DIAGNOSIS — R030 Elevated blood-pressure reading, without diagnosis of hypertension: Secondary | ICD-10-CM | POA: Diagnosis not present

## 2017-09-29 DIAGNOSIS — Z6824 Body mass index (BMI) 24.0-24.9, adult: Secondary | ICD-10-CM | POA: Diagnosis not present

## 2017-10-17 ENCOUNTER — Encounter: Payer: Self-pay | Admitting: Certified Nurse Midwife

## 2017-10-17 ENCOUNTER — Telehealth: Payer: Self-pay | Admitting: Certified Nurse Midwife

## 2017-11-08 ENCOUNTER — Other Ambulatory Visit: Payer: Self-pay | Admitting: Certified Nurse Midwife

## 2017-11-08 DIAGNOSIS — Z1231 Encounter for screening mammogram for malignant neoplasm of breast: Secondary | ICD-10-CM

## 2017-11-29 ENCOUNTER — Ambulatory Visit
Admission: RE | Admit: 2017-11-29 | Discharge: 2017-11-29 | Disposition: A | Discharge status: Home / Self Care | Payer: BLUE CROSS/BLUE SHIELD | Source: Ambulatory Visit | Attending: Certified Nurse Midwife | Admitting: Certified Nurse Midwife

## 2017-11-29 DIAGNOSIS — Z1231 Encounter for screening mammogram for malignant neoplasm of breast: Secondary | ICD-10-CM | POA: Diagnosis not present

## 2018-01-03 ENCOUNTER — Ambulatory Visit (INDEPENDENT_AMBULATORY_CARE_PROVIDER_SITE_OTHER): Payer: BLUE CROSS/BLUE SHIELD | Admitting: Certified Nurse Midwife

## 2018-01-03 ENCOUNTER — Other Ambulatory Visit (HOSPITAL_COMMUNITY)
Admission: RE | Admit: 2018-01-03 | Discharge: 2018-01-03 | Disposition: A | Discharge status: Home / Self Care | Payer: BLUE CROSS/BLUE SHIELD | Source: Ambulatory Visit | Attending: Certified Nurse Midwife | Admitting: Certified Nurse Midwife

## 2018-01-03 ENCOUNTER — Other Ambulatory Visit: Payer: Self-pay

## 2018-01-03 ENCOUNTER — Encounter: Payer: Self-pay | Admitting: Certified Nurse Midwife

## 2018-01-03 VITALS — BP 110/70 | HR 76 | Resp 12 | Ht 67.5 in | Wt 160.2 lb

## 2018-01-03 DIAGNOSIS — Z124 Encounter for screening for malignant neoplasm of cervix: Secondary | ICD-10-CM | POA: Diagnosis not present

## 2018-01-03 DIAGNOSIS — Z30431 Encounter for routine checking of intrauterine contraceptive device: Secondary | ICD-10-CM

## 2018-01-03 DIAGNOSIS — Z23 Encounter for immunization: Secondary | ICD-10-CM | POA: Diagnosis not present

## 2018-01-03 DIAGNOSIS — Z01419 Encounter for gynecological examination (general) (routine) without abnormal findings: Secondary | ICD-10-CM | POA: Diagnosis not present

## 2018-01-03 NOTE — Progress Notes (Signed)
52 y.o.53 [id number] Married  Caucasian Fe here for annual exam. Occasional hot flashes and night sweats. She is having no issues with at present. Denies vaginal bleeding or vaginal dryness. IIUD working well. Periods scant to none. Busy with daughter. Saw PCP for labs and exam, recently all normal. Daughter ( High functioning autistic patient here), went to camp for 13 days and patient actually had time for self and rode her horse! Still realizing the 24 hour day required for her care. Emotionally coping well, but has her days as she is getting older. Patient eating well, has lost 7 pounds since last year and feels healthy. No other health issues today.    No LMP recorded. (Menstrual status: IUD).          Sexually active: Yes.    The current method of family planning is IUD.    Exercising: Yes.    walking, physical labor, yoga  Smoker:  Former smoker   Review of Systems  Constitutional: Negative.   HENT: Negative.   Eyes: Negative.   Respiratory: Negative.   Cardiovascular: Negative.   Gastrointestinal: Negative.   Genitourinary: Negative.   Musculoskeletal: Negative.   Skin: Negative.   Neurological: Negative.   Endo/Heme/Allergies: Negative.   Psychiatric/Behavioral: Negative.     Health Maintenance: Pap:  12-19-15 negative, HR HPV negative           11-28-13 negative  History of Abnormal Pap: yes MMG:  11-29-17 density C/BIRADS 1 negative  Self Breast exams: yes Colonoscopy:  2016, 1 polyp- f/u 5 years  BMD:   None  TDaP:  06-15-07  Shingles: no Pneumonia: no Hep C and HIV: 12-19-15 negative hep c  Labs: PCP   reports that she has quit smoking. She has never used smokeless tobacco. She reports that she drinks about 6.0 oz of alcohol per week. She reports that she does not use drugs.  Past Medical History:  Diagnosis Date  . Abnormal Pap smear of cervix    many yrs ago    Past Surgical History:  Procedure Laterality Date  . APPENDECTOMY    . CESAREAN SECTION    .  INTRAUTERINE DEVICE (IUD) INSERTION     mirena inserted 06-05-10, due out 1/17, new mirena inserted 11/05/15    Current Outpatient Medications  Medication Sig Dispense Refill  . levonorgestrel (MIRENA) 20 MCG/24HR IUD 1 each by Intrauterine route once.     No current facility-administered medications for this visit.     Family History  Problem Relation Age of Onset  . Diabetes Father   . Coronary artery disease Father   . Colon cancer Father   . Cancer Father        prostate  . Hypertension Mother   . Emphysema Maternal Grandfather   . Cancer Paternal Grandmother   . Coronary artery disease Paternal Grandfather   . Hypertension Sister     ROS:  Pertinent items are noted in HPI.  Otherwise, a comprehensive ROS was negative.  Exam:   BP 110/70 (BP Location: Right Arm, Patient Position: Sitting, Cuff Size: Normal)   Pulse 76   Resp 12   Ht 5' 7.5" (1.715 m)   Wt 160 lb 4 oz (72.7 kg)   BMI 24.73 kg/m  Height: 5' 7.5" (171.5 cm) Ht Readings from Last 3 Encounters:  01/03/18 5' 7.5" (1.715 m)  12/21/16 5' 7.25" (1.708 m)  02/04/16 5' 7.25" (1.708 m)    General appearance: alert, cooperative and appears stated age Head: Normocephalic,  without obvious abnormality, atraumatic Neck: no adenopathy, supple, symmetrical, trachea midline and thyroid normal to inspection and palpation Lungs: clear to auscultation bilaterally Breasts: normal appearance, no masses or tenderness, No nipple retraction or dimpling, No nipple discharge or bleeding, No axillary or supraclavicular adenopathy Heart: regular rate and rhythm Abdomen: soft, non-tender; no masses,  no organomegaly Extremities: extremities normal, atraumatic, no cyanosis or edema Skin: Skin color, texture, turgor normal. No rashes or lesions Lymph nodes: Cervical, supraclavicular, and axillary nodes normal. No abnormal inguinal nodes palpated Neurologic: Grossly normal   Pelvic: External genitalia:  no lesions, normal female               Urethra:  normal appearing urethra with no masses, tenderness or lesions              Bartholin's and Skene's: normal                 Vagina: normal appearing vagina with normal color and discharge, no lesions              Cervix: no cervical motion tenderness, no lesions and normal appearance              Pap taken: Yes.   Bimanual Exam:  Uterus:  normal size, contour, position, consistency, mobility, non-tender and anteverted              Adnexa: normal adnexa and no mass, fullness, tenderness               Rectovaginal: Confirms               Anus:  normal sphincter tone, no lesions  Chaperone present: yes  A:  Well Woman with normal exam  Contraception  Mirena IUD due for removal 11/04/2020  Perimenopausal  Social stress with caring for daughter, has good support with family  Immunization update  P:   Reviewed health and wellness pertinent to exam  Warning signs with IUD reviewed  Aware of symptoms and expectations  Stressed seeking for time for self   Requests TDAP  Pap smear: no   counseled on breast self exam, mammography screening, feminine hygiene, adequate intake of calcium and vitamin D, diet and exercise  return annually or prn  An After Visit Summary was printed and given to the patient.

## 2018-01-03 NOTE — Patient Instructions (Signed)

## 2018-01-05 LAB — CYTOLOGY - PAP
Diagnosis: NEGATIVE
HPV: NOT DETECTED

## 2018-12-12 ENCOUNTER — Telehealth: Payer: Self-pay | Admitting: Certified Nurse Midwife

## 2018-12-12 NOTE — Telephone Encounter (Signed)
MyChart message not in regard to this patient. Patient contacted.    Encounter closed.

## 2018-12-12 NOTE — Telephone Encounter (Signed)
Patient sent the following message through [place]. Routing to triage to assist patient with request.   Hi Deb. Great to talk with you yesterday and I'm glad you are doing well and staying safe!  Amelia's non angry blood pressure:  103/73  pulse 67    I am not sure when Clyde Canterbury had her last tetanus shot. My copy of her immunization record is pretty old.     We will see you in the fall.    Webb Silversmith

## 2019-01-10 ENCOUNTER — Ambulatory Visit: Payer: BLUE CROSS/BLUE SHIELD | Admitting: Certified Nurse Midwife

## 2019-01-23 ENCOUNTER — Encounter: Payer: Self-pay | Admitting: Certified Nurse Midwife

## 2019-01-23 ENCOUNTER — Ambulatory Visit (INDEPENDENT_AMBULATORY_CARE_PROVIDER_SITE_OTHER): Payer: BC Managed Care – PPO | Admitting: Certified Nurse Midwife

## 2019-01-23 ENCOUNTER — Other Ambulatory Visit: Payer: Self-pay

## 2019-01-23 VITALS — BP 118/70 | HR 68 | Temp 97.3°F | Resp 16 | Ht 66.75 in | Wt 165.0 lb

## 2019-01-23 DIAGNOSIS — R7303 Prediabetes: Secondary | ICD-10-CM | POA: Diagnosis not present

## 2019-01-23 DIAGNOSIS — Z0000 Encounter for general adult medical examination without abnormal findings: Secondary | ICD-10-CM | POA: Diagnosis not present

## 2019-01-23 DIAGNOSIS — Z01419 Encounter for gynecological examination (general) (routine) without abnormal findings: Secondary | ICD-10-CM | POA: Diagnosis not present

## 2019-01-23 DIAGNOSIS — E559 Vitamin D deficiency, unspecified: Secondary | ICD-10-CM

## 2019-01-23 DIAGNOSIS — E118 Type 2 diabetes mellitus with unspecified complications: Secondary | ICD-10-CM | POA: Diagnosis not present

## 2019-01-23 DIAGNOSIS — R739 Hyperglycemia, unspecified: Secondary | ICD-10-CM | POA: Diagnosis not present

## 2019-01-23 NOTE — Progress Notes (Signed)
53 y.o. [id number] Married  Caucasian Fe here for annual exam. Periods none with IUD. Denies any warning signs with use. Occasional hot flashes, no night sweats. Sees PCP prn. Screening labs today. Stress has escalated with no school for Lauris Poagmelia who is Autistic. Mother and father working with her daily. No other health issues today.  No LMP recorded. (Menstrual status: IUD).          Sexually active: Yes.    The current method of family planning is IUD.    Exercising: Yes.    walk, running, farmwork Smoker:  no  Review of Systems  Constitutional: Negative.   HENT: Negative.   Eyes: Negative.   Respiratory: Negative.   Cardiovascular: Negative.   Gastrointestinal: Negative.   Genitourinary: Negative.   Musculoskeletal: Negative.   Skin: Negative.   Neurological: Negative.   Endo/Heme/Allergies: Negative.   Psychiatric/Behavioral: Negative.     Health Maintenance:, 01-03-18 neg HPV HR neg Pap:  12-19-15 neg HPV HR neg History of Abnormal Pap: yes MMG:  11-29-17 category c density birads 1:neg Self Breast exams: yes Colonoscopy:  2016 1 polyp f/u 1656yrs BMD:   none TDaP:  2019 Shingles: no Pneumonia: no Hep C and HIV: hep c neg 2017 Labs: yes   reports that she has quit smoking. She has never used smokeless tobacco. She reports current alcohol use of about 10.0 standard drinks of alcohol per week. She reports that she does not use drugs.  Past Medical History:  Diagnosis Date  . Abnormal Pap smear of cervix    many yrs ago    Past Surgical History:  Procedure Laterality Date  . APPENDECTOMY    . CESAREAN SECTION    . INTRAUTERINE DEVICE (IUD) INSERTION     mirena inserted 06-05-10, due out 1/17, new mirena inserted 11/05/15    Current Outpatient Medications  Medication Sig Dispense Refill  . levonorgestrel (MIRENA) 20 MCG/24HR IUD 1 each by Intrauterine route once.     No current facility-administered medications for this visit.     Family History  Problem Relation Age of  Onset  . Diabetes Father   . Coronary artery disease Father   . Colon cancer Father   . Cancer Father        prostate  . Hypertension Mother   . Emphysema Maternal Grandfather   . Cancer Paternal Grandmother   . Coronary artery disease Paternal Grandfather   . Hypertension Sister     ROS:  Pertinent items are noted in HPI.  Otherwise, a comprehensive ROS was negative.  Exam:   There were no vitals taken for this visit.   Ht Readings from Last 3 Encounters:  01/03/18 5' 7.5" (1.715 m)  12/21/16 5' 7.25" (1.708 m)  02/04/16 5' 7.25" (1.708 m)    General appearance: alert, cooperative and appears stated age Head: Normocephalic, without obvious abnormality, atraumatic Neck: no adenopathy, supple, symmetrical, trachea midline and thyroid normal to inspection and palpation Lungs: clear to auscultation bilaterally Breasts: normal appearance, no masses or tenderness, No nipple retraction or dimpling, No nipple discharge or bleeding, No axillary or supraclavicular adenopathy Heart: regular rate and rhythm Abdomen: soft, non-tender; no masses,  no organomegaly Extremities: extremities normal, atraumatic, no cyanosis or edema Skin: Skin color, texture, turgor normal. No rashes or lesions Lymph nodes: Cervical, supraclavicular, and axillary nodes normal. No abnormal inguinal nodes palpated Neurologic: Grossly normal   Pelvic: External genitalia:  no lesions  Urethra:  normal appearing urethra with no masses, tenderness or lesions              Bartholin's and Skene's: normal                 Vagina: normal appearing vagina with normal color and discharge, no lesions              Cervix: no cervical motion tenderness, no lesions and normal appearance IUD string noted in cervix, normal string length              Pap taken: No. Bimanual Exam:  Uterus:  normal size, contour, position, consistency, mobility, non-tender and anteverted              Adnexa: normal adnexa and no  mass, fullness, tenderness               Rectovaginal: Confirms               Anus:  normal sphincter tone, no lesions  Chaperone present: yes  A:  Well Woman with normal exam  Perimenopausal with scant menses with Mirena IUD  Contraception Mirena IUD  Social stress with daughter's education  Screening labs  P:   Reviewed health and wellness pertinent to exam  Aware of menopausal symptoms and period profile  Warning signs with IUD reviewed, due for removal in 2022  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  Lab: CMP, TSH, CBC,Lipid panel, Vitamin D  Pap smear: no   counseled on breast self exam, mammography screening, feminine hygiene, adequate intake of calcium and vitamin D, diet and exercise  return annually or prn  An After Visit Summary was printed and given to the patient.

## 2019-01-23 NOTE — Patient Instructions (Signed)

## 2019-01-24 LAB — COMPREHENSIVE METABOLIC PANEL
ALT: 20 IU/L (ref 0–32)
AST: 16 IU/L (ref 0–40)
Albumin/Globulin Ratio: 2.4 — ABNORMAL HIGH (ref 1.2–2.2)
Albumin: 4.6 g/dL (ref 3.8–4.9)
Alkaline Phosphatase: 50 IU/L (ref 39–117)
BUN/Creatinine Ratio: 19 (ref 9–23)
BUN: 15 mg/dL (ref 6–24)
Bilirubin Total: 0.3 mg/dL (ref 0.0–1.2)
CO2: 22 mmol/L (ref 20–29)
Calcium: 9.4 mg/dL (ref 8.7–10.2)
Chloride: 101 mmol/L (ref 96–106)
Creatinine, Ser: 0.77 mg/dL (ref 0.57–1.00)
GFR calc Af Amer: 101 mL/min/{1.73_m2} (ref >59)
GFR calc non Af Amer: 88 mL/min/{1.73_m2} (ref >59)
Globulin, Total: 1.9 g/dL (ref 1.5–4.5)
Glucose: 109 mg/dL — ABNORMAL HIGH (ref 65–99)
Potassium: 4.1 mmol/L (ref 3.5–5.2)
Sodium: 139 mmol/L (ref 134–144)
Total Protein: 6.5 g/dL (ref 6.0–8.5)

## 2019-01-24 LAB — CBC
Hematocrit: 36.2 % (ref 34.0–46.6)
Hemoglobin: 12.8 g/dL (ref 11.1–15.9)
MCH: 34.5 pg — ABNORMAL HIGH (ref 26.6–33.0)
MCHC: 35.4 g/dL (ref 31.5–35.7)
MCV: 98 fL — ABNORMAL HIGH (ref 79–97)
Platelets: 261 10*3/uL (ref 150–450)
RBC: 3.71 x10E6/uL — ABNORMAL LOW (ref 3.77–5.28)
RDW: 12.9 % (ref 11.7–15.4)
WBC: 6.7 10*3/uL (ref 3.4–10.8)

## 2019-01-24 LAB — LIPID PANEL
Chol/HDL Ratio: 2.9 ratio (ref 0.0–4.4)
Cholesterol, Total: 207 mg/dL — ABNORMAL HIGH (ref 100–199)
HDL: 72 mg/dL (ref >39)
LDL Calculated: 102 mg/dL — ABNORMAL HIGH (ref 0–99)
Triglycerides: 167 mg/dL — ABNORMAL HIGH (ref 0–149)
VLDL Cholesterol Cal: 33 mg/dL (ref 5–40)

## 2019-01-24 LAB — TSH: TSH: 1.16 u[IU]/mL (ref 0.450–4.500)

## 2019-01-24 LAB — VITAMIN D 25 HYDROXY (VIT D DEFICIENCY, FRACTURES): Vit D, 25-Hydroxy: 36.4 ng/mL (ref 30.0–100.0)

## 2019-01-25 ENCOUNTER — Other Ambulatory Visit: Payer: Self-pay | Admitting: Certified Nurse Midwife

## 2019-01-25 DIAGNOSIS — R6889 Other general symptoms and signs: Secondary | ICD-10-CM

## 2019-01-25 LAB — HGB A1C W/O EAG: Hgb A1c MFr Bld: 5.4 % (ref 4.8–5.6)

## 2019-01-25 LAB — SPECIMEN STATUS REPORT

## 2019-04-16 ENCOUNTER — Other Ambulatory Visit: Payer: Self-pay

## 2019-04-16 ENCOUNTER — Other Ambulatory Visit (INDEPENDENT_AMBULATORY_CARE_PROVIDER_SITE_OTHER): Payer: BC Managed Care – PPO

## 2019-04-16 DIAGNOSIS — R6889 Other general symptoms and signs: Secondary | ICD-10-CM | POA: Diagnosis not present

## 2019-04-17 LAB — TRIGLYCERIDES: Triglycerides: 77 mg/dL (ref 0–149)

## 2019-04-17 LAB — GLUCOSE, RANDOM: Glucose: 85 mg/dL (ref 65–99)

## 2019-08-20 ENCOUNTER — Encounter: Payer: Self-pay | Admitting: Certified Nurse Midwife

## 2019-10-09 ENCOUNTER — Other Ambulatory Visit: Payer: Self-pay | Admitting: Family Medicine

## 2019-10-09 DIAGNOSIS — Z1231 Encounter for screening mammogram for malignant neoplasm of breast: Secondary | ICD-10-CM

## 2019-10-17 ENCOUNTER — Other Ambulatory Visit: Payer: Self-pay

## 2019-10-17 ENCOUNTER — Ambulatory Visit: Payer: BLUE CROSS/BLUE SHIELD

## 2019-10-17 ENCOUNTER — Ambulatory Visit
Admission: RE | Admit: 2019-10-17 | Discharge: 2019-10-17 | Disposition: A | Discharge status: Home / Self Care | Payer: BLUE CROSS/BLUE SHIELD | Source: Ambulatory Visit | Attending: Family Medicine | Admitting: Family Medicine

## 2019-10-17 DIAGNOSIS — Z1231 Encounter for screening mammogram for malignant neoplasm of breast: Secondary | ICD-10-CM

## 2020-01-29 ENCOUNTER — Ambulatory Visit: Payer: BC Managed Care – PPO | Admitting: Certified Nurse Midwife

## 2020-07-10 DIAGNOSIS — N3000 Acute cystitis without hematuria: Secondary | ICD-10-CM | POA: Diagnosis not present

## 2020-07-29 DIAGNOSIS — Z800 Family history of malignant neoplasm of digestive organs: Secondary | ICD-10-CM | POA: Diagnosis not present

## 2020-07-29 DIAGNOSIS — Z86010 Personal history of colonic polyps: Secondary | ICD-10-CM | POA: Diagnosis not present

## 2020-07-29 DIAGNOSIS — Z1211 Encounter for screening for malignant neoplasm of colon: Secondary | ICD-10-CM | POA: Diagnosis not present

## 2020-07-29 DIAGNOSIS — Z8371 Family history of colonic polyps: Secondary | ICD-10-CM | POA: Diagnosis not present

## 2020-09-03 DIAGNOSIS — Z8371 Family history of colonic polyps: Secondary | ICD-10-CM | POA: Diagnosis not present

## 2020-09-03 DIAGNOSIS — K635 Polyp of colon: Secondary | ICD-10-CM | POA: Diagnosis not present

## 2020-09-03 DIAGNOSIS — D120 Benign neoplasm of cecum: Secondary | ICD-10-CM | POA: Diagnosis not present

## 2020-09-03 DIAGNOSIS — Z800 Family history of malignant neoplasm of digestive organs: Secondary | ICD-10-CM | POA: Diagnosis not present

## 2020-09-03 DIAGNOSIS — Z1211 Encounter for screening for malignant neoplasm of colon: Secondary | ICD-10-CM | POA: Diagnosis not present

## 2021-01-01 DIAGNOSIS — N924 Excessive bleeding in the premenopausal period: Secondary | ICD-10-CM | POA: Diagnosis not present

## 2022-01-27 ENCOUNTER — Other Ambulatory Visit: Payer: Self-pay | Admitting: Family

## 2022-01-27 DIAGNOSIS — Z1231 Encounter for screening mammogram for malignant neoplasm of breast: Secondary | ICD-10-CM

## 2022-02-11 DIAGNOSIS — Z1151 Encounter for screening for human papillomavirus (HPV): Secondary | ICD-10-CM | POA: Diagnosis not present

## 2022-02-11 DIAGNOSIS — Z0000 Encounter for general adult medical examination without abnormal findings: Secondary | ICD-10-CM | POA: Diagnosis not present

## 2022-02-11 DIAGNOSIS — Z01419 Encounter for gynecological examination (general) (routine) without abnormal findings: Secondary | ICD-10-CM | POA: Diagnosis not present

## 2022-02-11 DIAGNOSIS — Z6825 Body mass index (BMI) 25.0-25.9, adult: Secondary | ICD-10-CM | POA: Diagnosis not present

## 2022-02-24 ENCOUNTER — Ambulatory Visit
Admission: RE | Admit: 2022-02-24 | Discharge: 2022-02-24 | Disposition: A | Discharge status: Home / Self Care | Payer: BC Managed Care – PPO | Source: Ambulatory Visit | Attending: Family | Admitting: Family

## 2022-02-24 DIAGNOSIS — Z1231 Encounter for screening mammogram for malignant neoplasm of breast: Secondary | ICD-10-CM

## 2022-10-11 DIAGNOSIS — N3000 Acute cystitis without hematuria: Secondary | ICD-10-CM | POA: Diagnosis not present

## 2022-10-11 DIAGNOSIS — R300 Dysuria: Secondary | ICD-10-CM | POA: Diagnosis not present

## 2022-11-04 DIAGNOSIS — Z79899 Other long term (current) drug therapy: Secondary | ICD-10-CM | POA: Diagnosis not present

## 2022-11-04 DIAGNOSIS — N951 Menopausal and female climacteric states: Secondary | ICD-10-CM | POA: Diagnosis not present

## 2022-11-04 DIAGNOSIS — N912 Amenorrhea, unspecified: Secondary | ICD-10-CM | POA: Diagnosis not present

## 2022-11-04 DIAGNOSIS — R5383 Other fatigue: Secondary | ICD-10-CM | POA: Diagnosis not present

## 2022-11-04 DIAGNOSIS — N952 Postmenopausal atrophic vaginitis: Secondary | ICD-10-CM | POA: Diagnosis not present

## 2022-11-18 DIAGNOSIS — N952 Postmenopausal atrophic vaginitis: Secondary | ICD-10-CM | POA: Diagnosis not present

## 2023-03-09 DIAGNOSIS — N3000 Acute cystitis without hematuria: Secondary | ICD-10-CM | POA: Diagnosis not present

## 2023-05-12 DIAGNOSIS — A6000 Herpesviral infection of urogenital system, unspecified: Secondary | ICD-10-CM | POA: Diagnosis not present

## 2023-06-03 ENCOUNTER — Other Ambulatory Visit: Payer: Self-pay | Admitting: Family Medicine

## 2023-06-03 DIAGNOSIS — Z0000 Encounter for general adult medical examination without abnormal findings: Secondary | ICD-10-CM

## 2023-06-21 ENCOUNTER — Ambulatory Visit
Admission: RE | Admit: 2023-06-21 | Discharge: 2023-06-21 | Disposition: A | Discharge status: Home / Self Care | Payer: BC Managed Care – PPO | Source: Ambulatory Visit | Attending: Family Medicine | Admitting: Family Medicine

## 2023-06-21 DIAGNOSIS — Z1231 Encounter for screening mammogram for malignant neoplasm of breast: Secondary | ICD-10-CM | POA: Diagnosis not present

## 2023-06-21 DIAGNOSIS — Z0000 Encounter for general adult medical examination without abnormal findings: Secondary | ICD-10-CM

## 2023-07-19 DIAGNOSIS — N951 Menopausal and female climacteric states: Secondary | ICD-10-CM | POA: Diagnosis not present

## 2023-07-21 DIAGNOSIS — Z13820 Encounter for screening for osteoporosis: Secondary | ICD-10-CM | POA: Diagnosis not present

## 2024-01-14 DIAGNOSIS — S91311A Laceration without foreign body, right foot, initial encounter: Secondary | ICD-10-CM | POA: Diagnosis not present

## 2024-01-14 DIAGNOSIS — Z4800 Encounter for change or removal of nonsurgical wound dressing: Secondary | ICD-10-CM | POA: Diagnosis not present

## 2024-01-27 DIAGNOSIS — S91119A Laceration without foreign body of unspecified toe without damage to nail, initial encounter: Secondary | ICD-10-CM | POA: Diagnosis not present
